# Patient Record
Sex: Female | Born: 1965 | Race: Black or African American | Hispanic: No | Marital: Single | Smoking: Former smoker
Health system: Southern US, Community
[De-identification: ages and names within clinical notes are randomized; demographics above are authoritative.]

## PROBLEM LIST (undated history)

## (undated) DIAGNOSIS — F32A Depression, unspecified: Secondary | ICD-10-CM

## (undated) DIAGNOSIS — F329 Major depressive disorder, single episode, unspecified: Secondary | ICD-10-CM

## (undated) DIAGNOSIS — Z87442 Personal history of urinary calculi: Secondary | ICD-10-CM

## (undated) DIAGNOSIS — I10 Essential (primary) hypertension: Secondary | ICD-10-CM

## (undated) DIAGNOSIS — E119 Type 2 diabetes mellitus without complications: Secondary | ICD-10-CM

---

## 2005-04-20 ENCOUNTER — Ambulatory Visit: Payer: Self-pay

## 2006-07-04 ENCOUNTER — Emergency Department: Payer: Self-pay | Admitting: Emergency Medicine

## 2006-07-04 ENCOUNTER — Ambulatory Visit: Payer: Self-pay | Admitting: Obstetrics and Gynecology

## 2007-06-27 ENCOUNTER — Ambulatory Visit: Payer: Self-pay

## 2008-10-16 ENCOUNTER — Ambulatory Visit: Payer: Self-pay | Admitting: Family Medicine

## 2010-07-07 ENCOUNTER — Ambulatory Visit: Payer: Self-pay | Admitting: Family Medicine

## 2012-07-03 ENCOUNTER — Ambulatory Visit: Payer: Self-pay | Admitting: Primary Care

## 2014-04-10 ENCOUNTER — Ambulatory Visit: Payer: Self-pay | Admitting: Primary Care

## 2016-01-23 ENCOUNTER — Encounter: Payer: Self-pay | Admitting: *Deleted

## 2016-01-23 ENCOUNTER — Ambulatory Visit
Admission: EM | Admit: 2016-01-23 | Discharge: 2016-01-23 | Disposition: A | Discharge status: Home / Self Care | Payer: 59 | Attending: Emergency Medicine | Admitting: Emergency Medicine

## 2016-01-23 ENCOUNTER — Ambulatory Visit (INDEPENDENT_AMBULATORY_CARE_PROVIDER_SITE_OTHER): Payer: 59

## 2016-01-23 DIAGNOSIS — R319 Hematuria, unspecified: Secondary | ICD-10-CM | POA: Diagnosis not present

## 2016-01-23 DIAGNOSIS — R109 Unspecified abdominal pain: Secondary | ICD-10-CM

## 2016-01-23 DIAGNOSIS — N12 Tubulo-interstitial nephritis, not specified as acute or chronic: Secondary | ICD-10-CM

## 2016-01-23 HISTORY — DX: Type 2 diabetes mellitus without complications: E11.9

## 2016-01-23 HISTORY — DX: Essential (primary) hypertension: I10

## 2016-01-23 LAB — URINALYSIS COMPLETE WITH MICROSCOPIC (ARMC ONLY)
BILIRUBIN URINE: NEGATIVE
GLUCOSE, UA: 500 mg/dL — AB
Ketones, ur: NEGATIVE mg/dL
Leukocytes, UA: NEGATIVE
NITRITE: NEGATIVE
Protein, ur: 100 mg/dL — AB
SPECIFIC GRAVITY, URINE: 1.02 (ref 1.005–1.030)
pH: 5.5 (ref 5.0–8.0)

## 2016-01-23 LAB — COMPREHENSIVE METABOLIC PANEL
ALBUMIN: 4.1 g/dL (ref 3.5–5.0)
ALK PHOS: 79 U/L (ref 38–126)
ALT: 17 U/L (ref 14–54)
ANION GAP: 8 (ref 5–15)
AST: 17 U/L (ref 15–41)
BUN: 16 mg/dL (ref 6–20)
CALCIUM: 9.2 mg/dL (ref 8.9–10.3)
CO2: 25 mmol/L (ref 22–32)
Chloride: 96 mmol/L — ABNORMAL LOW (ref 101–111)
Creatinine, Ser: 1.02 mg/dL — ABNORMAL HIGH (ref 0.44–1.00)
GFR calc Af Amer: >60 mL/min (ref >60)
GFR calc non Af Amer: >60 mL/min (ref >60)
GLUCOSE: 277 mg/dL — AB (ref 65–99)
POTASSIUM: 4.5 mmol/L (ref 3.5–5.1)
SODIUM: 129 mmol/L — AB (ref 135–145)
Total Bilirubin: 1 mg/dL (ref 0.3–1.2)
Total Protein: 9.4 g/dL — ABNORMAL HIGH (ref 6.5–8.1)

## 2016-01-23 LAB — CBC WITH DIFFERENTIAL/PLATELET
BASOS ABS: 0.1 10*3/uL (ref 0–0.1)
Eosinophils Absolute: 0.1 10*3/uL (ref 0–0.7)
HCT: 36.8 % (ref 35.0–47.0)
HEMOGLOBIN: 12.2 g/dL (ref 12.0–16.0)
LYMPHS ABS: 2.2 10*3/uL (ref 1.0–3.6)
MCH: 25.6 pg — ABNORMAL LOW (ref 26.0–34.0)
MCHC: 33.1 g/dL (ref 32.0–36.0)
MCV: 77.4 fL — ABNORMAL LOW (ref 80.0–100.0)
MONO ABS: 1.3 10*3/uL — AB (ref 0.2–0.9)
Monocytes Relative: 9 %
Neutro Abs: 11 10*3/uL — ABNORMAL HIGH (ref 1.4–6.5)
Neutrophils Relative %: 74 %
Platelets: 347 10*3/uL (ref 150–440)
RBC: 4.75 MIL/uL (ref 3.80–5.20)
RDW: 13.3 % (ref 11.5–14.5)
WBC: 14.7 10*3/uL — ABNORMAL HIGH (ref 3.6–11.0)

## 2016-01-23 LAB — PREGNANCY, URINE: Preg Test, Ur: NEGATIVE

## 2016-01-23 MED ORDER — PHENAZOPYRIDINE HCL 200 MG PO TABS: 200.0000 mg | ORAL_TABLET | Freq: Three times a day (TID) | ORAL | Status: DC | PRN

## 2016-01-23 MED ORDER — IBUPROFEN 800 MG PO TABS: 800.0000 mg | ORAL_TABLET | Freq: Three times a day (TID) | ORAL | Status: DC

## 2016-01-23 MED ORDER — KETOROLAC TROMETHAMINE 60 MG/2ML IM SOLN
60.0000 mg | Freq: Once | INTRAMUSCULAR | Status: AC
  Administered 2016-01-23: 60 mg via INTRAMUSCULAR

## 2016-01-23 MED ORDER — CEPHALEXIN 500 MG PO CAPS: 500.0000 mg | ORAL_CAPSULE | Freq: Three times a day (TID) | ORAL | Status: DC

## 2016-01-23 NOTE — Discharge Instructions (Signed)
Make sure he finished all of the antibiotics. The Pyridium will turn your urine orange, but it will help with the dysuria, urinary urgency, frequency. Make sure you drink plenty of fluids. Take Tylenol and ibuprofen together as needed for pain. Take 1 g of Tylenol with the ibuprofen 3 times a day. Follow-up with urology because of the blood in your urine. Go to the ER for nausea, vomiting, fevers above 100.4 despite being on the antibiotics, pain not controlled with medications, a change in your pain, or other concerns.

## 2016-01-23 NOTE — ED Notes (Signed)
Pt states that she has chills and left flank pain that started 3 days ago.

## 2016-01-23 NOTE — ED Provider Notes (Addendum)
HPI  SUBJECTIVE:  Emma Bishop is a 50 y.o. female who presents with urinary dysuria, urgency, frequency, cloudy and odorous urine, dark urine starting yesterday. She reports left-sided intermittent, low back pain that she describes as stabbing, burning, lasting anywhere from minutes to hours. She denies radiation or migration of this pain. States that she feels like she has difficulty emptying her bladder. She denies fevers, but reports chills. Denies bodyaches. No vaginal bleeding, discharge, odor, abdominal pain. No urinary or fecal incontinence, saddle anesthesia, weakness in either leg, radicular symptoms. No recent heavy lifting or trauma to her back. The back pain is not associated with any movement. She has taken Tylenol in the past 6-8 hours. No antibiotics in the past month. Past medical history of diabetes, does not check her sugar at home, states that she does not have a meter. Also history of hypertension. No history of UTIs, nephrolithiasis, pyelonephritis. Family history significant for brother with nephrolithiasis. LMP: February. Patient is sexually active. She is not sure if she could be pregnant. PMD: Dr. Freddy Finner at the [facility] clinic. She has an appt with her on Thursday.    Past Medical History  Diagnosis Date  . Diabetes mellitus without complication ([place])   . Hypertension     History reviewed. No pertinent past surgical history.  No family history on file.  Social History  Substance Use Topics  . Smoking status: Former Research scientist (life sciences)  . Smokeless tobacco: None  . Alcohol Use: Yes    No current facility-administered medications for this encounter.  Current outpatient prescriptions:  .  benazepril-hydrochlorthiazide (LOTENSIN HCT) 20-25 MG tablet, Take 1 tablet by mouth daily., Disp: , Rfl:  .  glipiZIDE (GLUCOTROL) 10 MG tablet, Take 10 mg by mouth daily before breakfast., Disp: , Rfl:  .  metFORMIN (GLUCOPHAGE) 1000 MG tablet, Take 1,000 mg by mouth 2  (two) times daily with a meal., Disp: , Rfl:  .  TRAZODONE HCL PO, Take by mouth., Disp: , Rfl:  .  cephALEXin (KEFLEX) 500 MG capsule, Take 1 capsule (500 mg total) by mouth 3 (three) times daily. X 14 days, Disp: 42 capsule, Rfl: 0 .  ibuprofen (ADVIL,MOTRIN) 800 MG tablet, Take 1 tablet (800 mg total) by mouth 3 (three) times daily., Disp: 30 tablet, Rfl: 0 .  phenazopyridine (PYRIDIUM) 200 MG tablet, Take 1 tablet (200 mg total) by mouth 3 (three) times daily as needed for pain., Disp: 6 tablet, Rfl: 0  No Known Allergies   ROS  As noted in HPI.   Physical Exam  BP 141/78 mmHg  Pulse 95  Temp(Src) 100.2 F (37.9 C) (Oral)  Ht 5\' 2"  (1.575 m)  Wt 287 lb (130.182 kg)  BMI 52.48 kg/m2  SpO2 96%  LMP 11/04/2015 (Approximate)  Constitutional: Well developed, well nourished, no acute distress. Morbidly obese. Eyes:  EOMI, conjunctiva normal bilaterally HENT: Normocephalic, atraumatic,mucus membranes moist Respiratory: Normal inspiratory effort Cardiovascular: Normal rate GI: nondistended, Soft, nontender. No suprapubic, flank tenderness. Normal bowel sounds. skin: No rash, skin intact Back: Left CVA tenderness. Musculoskeletal: - paralumbar tenderness, - muscle spasm. No bony tenderness. Bilateral lower extremities nontender. No pain with int/ext rotation flex/extension hips bilaterally. SLR neg bilaterally. Sensation baseline light touch bilaterally for Pt, DTR's symmetric and intact bilaterally KJ, Motor symmetric bilateral 5/5 hip flexion, quadriceps, hamstrings, EHL, foot dorsiflexion, foot plantarflexion, gait normal. Pain not aggravated with active hip flexion against resistance. Neurologic: Alert & oriented x 3, no focal neuro deficits Psychiatric: Speech and behavior  appropriate   ED Course   Medications  ketorolac (TORADOL) injection 60 mg (60 mg Intramuscular Given 01/23/16 1759)    Orders Placed This Encounter  Procedures  . Urine culture    Standing Status:  Standing     Number of Occurrences: 1     Standing Expiration Date:   . CT Renal Stone Study    Standing Status: Standing     Number of Occurrences: 1     Standing Expiration Date:     Order Specific Question:  Reason for Exam (SYMPTOM  OR DIAGNOSIS REQUIRED)    Answer:  Hematuria, left CVA tenderness rule out obstructing stone  . Urinalysis complete, with microscopic    Standing Status: Standing     Number of Occurrences: 1     Standing Expiration Date:   . Pregnancy, urine    Standing Status: Standing     Number of Occurrences: 1     Standing Expiration Date:   . CBC with Differential    Standing Status: Standing     Number of Occurrences: 1     Standing Expiration Date:   . Comprehensive metabolic panel    Standing Status: Standing     Number of Occurrences: 1     Standing Expiration Date:     Results for orders placed or performed during the hospital encounter of 01/23/16 (from the past 24 hour(s))  Urinalysis complete, with microscopic     Status: Abnormal   Collection Time: 01/23/16  4:45 PM  Result Value Ref Range   Color, Urine YELLOW YELLOW   APPearance HAZY (A) CLEAR   Glucose, UA 500 (A) NEGATIVE mg/dL   Bilirubin Urine NEGATIVE NEGATIVE   Ketones, ur NEGATIVE NEGATIVE mg/dL   Specific Gravity, Urine 1.020 1.005 - 1.030   Hgb urine dipstick 2+ (A) NEGATIVE   pH 5.5 5.0 - 8.0   Protein, ur 100 (A) NEGATIVE mg/dL   Nitrite NEGATIVE NEGATIVE   Leukocytes, UA NEGATIVE NEGATIVE   RBC / HPF TOO NUMEROUS TO COUNT 0 - 5 RBC/hpf   WBC, UA 0-5 0 - 5 WBC/hpf   Bacteria, UA FEW (A) NONE SEEN   Squamous Epithelial / LPF 0-5 (A) NONE SEEN  Pregnancy, urine     Status: None   Collection Time: 01/23/16  4:45 PM  Result Value Ref Range   Preg Test, Ur NEGATIVE NEGATIVE  CBC with Differential     Status: Abnormal   Collection Time: 01/23/16  5:46 PM  Result Value Ref Range   WBC 14.7 (H) 3.6 - 11.0 K/uL   RBC 4.75 3.80 - 5.20 MIL/uL   Hemoglobin 12.2 12.0 - 16.0 g/dL    HCT 36.8 35.0 - 47.0 %   MCV 77.4 (L) 80.0 - 100.0 fL   MCH 25.6 (L) 26.0 - 34.0 pg   MCHC 33.1 32.0 - 36.0 g/dL   RDW 13.3 11.5 - 14.5 %   Platelets 347 150 - 440 K/uL   Neutrophils Relative % 74% %   Neutro Abs 11.0 (H) 1.4 - 6.5 K/uL   Lymphocytes Relative 15% %   Lymphs Abs 2.2 1.0 - 3.6 K/uL   Monocytes Relative 9% %   Monocytes Absolute 1.3 (H) 0.2 - 0.9 K/uL   Eosinophils Relative 1% %   Eosinophils Absolute 0.1 0 - 0.7 K/uL   Basophils Relative 1% %   Basophils Absolute 0.1 0 - 0.1 K/uL  Comprehensive metabolic panel     Status: Abnormal   Collection Time:  01/23/16  5:46 PM  Result Value Ref Range   Sodium 129 (L) 135 - 145 mmol/L   Potassium 4.5 3.5 - 5.1 mmol/L   Chloride 96 (L) 101 - 111 mmol/L   CO2 25 22 - 32 mmol/L   Glucose, Bld 277 (H) 65 - 99 mg/dL   BUN 16 6 - 20 mg/dL   Creatinine, Ser 1.02 (H) 0.44 - 1.00 mg/dL   Calcium 9.2 8.9 - 10.3 mg/dL   Total Protein 9.4 (H) 6.5 - 8.1 g/dL   Albumin 4.1 3.5 - 5.0 g/dL   AST 17 15 - 41 U/L   ALT 17 14 - 54 U/L   Alkaline Phosphatase 79 38 - 126 U/L   Total Bilirubin 1.0 0.3 - 1.2 mg/dL   GFR calc non Af Amer >60 >60 mL/min   GFR calc Af Amer >60 >60 mL/min   Anion gap 8 5 - 15   Ct Renal Stone Study  01/23/2016  CLINICAL DATA:  LEFT side pain for 1 day, hematuria, LEFT CVA tenderness, question obstructing kidney stone EXAM: CT ABDOMEN AND PELVIS WITHOUT CONTRAST TECHNIQUE: Multidetector CT imaging of the abdomen and pelvis was performed following the standard protocol without IV contrast. Sagittal and coronal MPR images reconstructed from axial data set. Oral contrast not administered for this indication. COMPARISON:  None FINDINGS: Degradation of image quality secondary to body habitus with scattered streak artifacts. Lung bases clear. No definite urinary tract calcification, hydronephrosis, or ureteral dilatation. Within limits of a nonenhanced exam no focal abnormalities of the liver, gallbladder, spleen, pancreas,  kidneys, or adrenal glands. Unremarkable bladder and ureters. Slight nodular uterus question leiomyomata. Appendix and adnexa unremarkable. Stomach and bowel loops grossly normal appearance for technique. No mass, adenopathy, free air, free fluid, or inflammatory process. Osseous structures normal. IMPRESSION: No acute intra-abdominal or intrapelvic abnormalities. Question uterine leiomyomata. Electronically Signed   By: Lavonia Dana M.D.   On: 01/23/2016 18:26    ED Clinical Impression  Hematuria  Flank pain - Plan: CANCELED: CT Abdomen Pelvis Wo Contrast, CANCELED: CT Abdomen Pelvis Wo Contrast  Pyelonephritis   ED Assessment/Plan   Patient was given Toradol with significant improvement in her symptoms.   Reviewed labs. Patient has hematuria, but no bacteria, nitrite, leukocytes. Glucose and proteinuria noted.  no previous UAs for comparison. Concern for obstructing stone given fever and CVA tenderness. We'll check CBC, CMP, noncontrast abd/pelvis.  Urine culture sent.  Patient has white count is 14.7 with a left shift. Slightly hyponatremic, but she also has hyperglycemia. There is no evidence of acidosis. BUN and creatinine are acceptable. We'll treat as a pyelonephritis given the CVA tenderness and the fever with Keflex 500 mg 3 times a day, Pyridium. Also ibuprofen 800 mg 3 times a day as needed for fever and pain. Advised patient to follow-up with Dr. Hollice Espy, urology on call for further evaluation of the hematuria. She'll strain all of her urine. Patient was given a urine strainer.  Discussed labs, imaging, MDM, plan and followup with patient. gave patient strict ER return precautions.  Patient  agrees with plan.  *This clinic note was created using Dragon dictation software. Therefore, there may be occasional mistakes despite careful proofreading.  ?   Melynda Ripple, MD 01/23/16 Valerie Roys  Melynda Ripple, MD 01/23/16 [phone]

## 2016-01-25 LAB — URINE CULTURE: Culture: 80000 — AB

## 2017-04-27 ENCOUNTER — Other Ambulatory Visit: Payer: Self-pay | Admitting: Primary Care

## 2017-04-27 DIAGNOSIS — Z1231 Encounter for screening mammogram for malignant neoplasm of breast: Secondary | ICD-10-CM

## 2017-06-19 ENCOUNTER — Encounter: Payer: Self-pay | Admitting: Radiology

## 2017-06-19 ENCOUNTER — Ambulatory Visit
Admission: RE | Admit: 2017-06-19 | Discharge: 2017-06-19 | Disposition: A | Discharge status: Home / Self Care | Payer: 59 | Source: Ambulatory Visit | Attending: Primary Care | Admitting: Primary Care

## 2017-06-19 DIAGNOSIS — Z1231 Encounter for screening mammogram for malignant neoplasm of breast: Secondary | ICD-10-CM | POA: Diagnosis not present

## 2017-10-03 ENCOUNTER — Encounter: Payer: Self-pay | Admitting: *Deleted

## 2017-10-04 ENCOUNTER — Ambulatory Visit: Admission: RE | Admit: 2017-10-04 | Payer: 59 | Source: Ambulatory Visit | Admitting: Internal Medicine

## 2017-10-04 ENCOUNTER — Encounter: Admission: RE | Payer: Self-pay | Source: Ambulatory Visit

## 2017-10-04 HISTORY — DX: Personal history of urinary calculi: Z87.442

## 2017-10-04 HISTORY — DX: Depression, unspecified: F32.A

## 2017-10-04 HISTORY — DX: Major depressive disorder, single episode, unspecified: F32.9

## 2017-10-04 SURGERY — COLONOSCOPY WITH PROPOFOL
Anesthesia: General

## 2018-06-22 ENCOUNTER — Other Ambulatory Visit: Payer: Self-pay | Admitting: Primary Care

## 2018-06-22 DIAGNOSIS — Z1231 Encounter for screening mammogram for malignant neoplasm of breast: Secondary | ICD-10-CM

## 2018-07-09 ENCOUNTER — Ambulatory Visit
Admission: RE | Admit: 2018-07-09 | Discharge: 2018-07-09 | Disposition: A | Discharge status: Home / Self Care | Payer: BLUE CROSS/BLUE SHIELD | Source: Ambulatory Visit | Attending: Primary Care | Admitting: Primary Care

## 2018-07-09 DIAGNOSIS — Z1231 Encounter for screening mammogram for malignant neoplasm of breast: Secondary | ICD-10-CM

## 2018-08-24 ENCOUNTER — Other Ambulatory Visit: Payer: Self-pay

## 2018-08-24 DIAGNOSIS — Z1211 Encounter for screening for malignant neoplasm of colon: Secondary | ICD-10-CM

## 2018-09-20 ENCOUNTER — Encounter: Payer: Self-pay | Admitting: Emergency Medicine

## 2018-09-21 ENCOUNTER — Ambulatory Visit
Admission: RE | Admit: 2018-09-21 | Discharge: 2018-09-21 | Disposition: A | Discharge status: Home / Self Care | Payer: BLUE CROSS/BLUE SHIELD | Attending: Gastroenterology | Admitting: Gastroenterology

## 2018-09-21 ENCOUNTER — Ambulatory Visit: Payer: BLUE CROSS/BLUE SHIELD | Admitting: Anesthesiology

## 2018-09-21 ENCOUNTER — Telehealth: Payer: Self-pay | Admitting: Gastroenterology

## 2018-09-21 ENCOUNTER — Encounter: Payer: Self-pay | Admitting: *Deleted

## 2018-09-21 ENCOUNTER — Encounter
Admission: RE | Disposition: A | Discharge status: Home / Self Care | Payer: Self-pay | Source: Home / Self Care | Attending: Gastroenterology

## 2018-09-21 DIAGNOSIS — D122 Benign neoplasm of ascending colon: Secondary | ICD-10-CM | POA: Diagnosis not present

## 2018-09-21 DIAGNOSIS — Z87891 Personal history of nicotine dependence: Secondary | ICD-10-CM | POA: Insufficient documentation

## 2018-09-21 DIAGNOSIS — Z7984 Long term (current) use of oral hypoglycemic drugs: Secondary | ICD-10-CM | POA: Insufficient documentation

## 2018-09-21 DIAGNOSIS — Z7982 Long term (current) use of aspirin: Secondary | ICD-10-CM | POA: Diagnosis not present

## 2018-09-21 DIAGNOSIS — Z87442 Personal history of urinary calculi: Secondary | ICD-10-CM | POA: Diagnosis not present

## 2018-09-21 DIAGNOSIS — Z1211 Encounter for screening for malignant neoplasm of colon: Secondary | ICD-10-CM | POA: Diagnosis not present

## 2018-09-21 DIAGNOSIS — K6389 Other specified diseases of intestine: Secondary | ICD-10-CM

## 2018-09-21 DIAGNOSIS — K5730 Diverticulosis of large intestine without perforation or abscess without bleeding: Secondary | ICD-10-CM | POA: Insufficient documentation

## 2018-09-21 DIAGNOSIS — Z79899 Other long term (current) drug therapy: Secondary | ICD-10-CM | POA: Insufficient documentation

## 2018-09-21 DIAGNOSIS — K648 Other hemorrhoids: Secondary | ICD-10-CM | POA: Insufficient documentation

## 2018-09-21 DIAGNOSIS — I10 Essential (primary) hypertension: Secondary | ICD-10-CM | POA: Diagnosis not present

## 2018-09-21 DIAGNOSIS — D125 Benign neoplasm of sigmoid colon: Secondary | ICD-10-CM | POA: Diagnosis not present

## 2018-09-21 DIAGNOSIS — E119 Type 2 diabetes mellitus without complications: Secondary | ICD-10-CM | POA: Insufficient documentation

## 2018-09-21 DIAGNOSIS — F329 Major depressive disorder, single episode, unspecified: Secondary | ICD-10-CM | POA: Insufficient documentation

## 2018-09-21 HISTORY — PX: COLONOSCOPY WITH PROPOFOL: SHX5780

## 2018-09-21 LAB — GLUCOSE, CAPILLARY: Glucose-Capillary: 156 mg/dL — ABNORMAL HIGH (ref 70–99)

## 2018-09-21 LAB — POCT PREGNANCY, URINE: Preg Test, Ur: NEGATIVE

## 2018-09-21 SURGERY — COLONOSCOPY WITH PROPOFOL
Anesthesia: General

## 2018-09-21 MED ORDER — GLYCOPYRROLATE 0.2 MG/ML IJ SOLN
INTRAMUSCULAR | Status: AC
  Filled 2018-09-21: qty 1

## 2018-09-21 MED ORDER — SODIUM CHLORIDE 0.9 % IV SOLN
INTRAVENOUS | Status: DC
  Administered 2018-09-21: 08:00:00 via INTRAVENOUS

## 2018-09-21 MED ORDER — MIDAZOLAM HCL 2 MG/2ML IJ SOLN
INTRAMUSCULAR | Status: DC | PRN
  Administered 2018-09-21: 2 mg via INTRAVENOUS

## 2018-09-21 MED ORDER — PROPOFOL 500 MG/50ML IV EMUL
INTRAVENOUS | Status: DC | PRN
  Administered 2018-09-21: 150 ug/kg/min via INTRAVENOUS

## 2018-09-21 MED ORDER — PROPOFOL 500 MG/50ML IV EMUL
INTRAVENOUS | Status: AC
  Filled 2018-09-21: qty 50

## 2018-09-21 MED ORDER — MIDAZOLAM HCL 2 MG/2ML IJ SOLN
INTRAMUSCULAR | Status: AC
  Filled 2018-09-21: qty 2

## 2018-09-21 MED ORDER — PROPOFOL 10 MG/ML IV BOLUS
INTRAVENOUS | Status: DC | PRN
  Administered 2018-09-21: 80 mg via INTRAVENOUS

## 2018-09-21 MED ORDER — SUCCINYLCHOLINE CHLORIDE 20 MG/ML IJ SOLN
INTRAMUSCULAR | Status: AC
  Filled 2018-09-21: qty 1

## 2018-09-21 MED ORDER — LIDOCAINE HCL (CARDIAC) PF 100 MG/5ML IV SOSY
PREFILLED_SYRINGE | INTRAVENOUS | Status: DC | PRN
  Administered 2018-09-21: 40 mg via INTRAVENOUS

## 2018-09-21 MED ORDER — LIDOCAINE HCL (PF) 2 % IJ SOLN
INTRAMUSCULAR | Status: AC
  Filled 2018-09-21: qty 10

## 2018-09-21 NOTE — Anesthesia Procedure Notes (Signed)
Date/Time: 09/21/2018 8:16 AM Performed by: Doreen Salvage, CRNA Pre-anesthesia Checklist: Patient identified, Emergency Drugs available, Suction available and Patient being monitored Patient Re-evaluated:Patient Re-evaluated prior to induction Oxygen Delivery Method: Nasal cannula Induction Type: IV induction Dental Injury: Teeth and Oropharynx as per pre-operative assessment  Comments: Nasal cannula with etCO2 monitoring

## 2018-09-21 NOTE — Transfer of Care (Signed)
Immediate Anesthesia Transfer of Care Note  Patient: Emma Bishop  Procedure(s) Performed: Procedure(s): COLONOSCOPY WITH PROPOFOL (N/A)  Patient Location: PACU and Endoscopy Unit  Anesthesia Type:General  Level of Consciousness: sedated  Airway & Oxygen Therapy: Patient Spontanous Breathing and Patient connected to nasal cannula oxygen  Post-op Assessment: Report given to RN and Post -op Vital signs reviewed and stable  Post vital signs: Reviewed and stable  Last Vitals:  Vitals:   09/21/18 0740 09/21/18 0852  BP: 102/71 139/87  Pulse: (!) 105 88  Resp: 20 16  Temp: 36.8 C (!) 36.1 C  SpO2: 43% 20%    Complications: No apparent anesthesia complications

## 2018-09-21 NOTE — Op Note (Signed)
[facility] Patient Name: Zuriel Yeaman Procedure Date: 09/21/2018 7:21 AM MRN: [id number] Account #: [id number] Date of Birth: 1966-01-06 Admit Type: Outpatient Age: 53 Room: [facility] ENDO ROOM 2 Gender: Female Note Status: Finalized Procedure:            Colonoscopy Indications:          Screening for colorectal malignant neoplasm Providers:            Symphonie Schneiderman B. Bonna Gains MD, MD Medicines:            Monitored Anesthesia Care Complications:        No immediate complications. Procedure:            Pre-Anesthesia Assessment:                       - ASA Grade Assessment: II - A patient with mild                        systemic disease.                       - Prior to the procedure, a History and Physical was                        performed, and patient medications, allergies and                        sensitivities were reviewed. The patient's tolerance of                        previous anesthesia was reviewed.                       - The risks and benefits of the procedure and the                        sedation options and risks were discussed with the                        patient. All questions were answered and informed                        consent was obtained.                       - Patient identification and proposed procedure were                        verified prior to the procedure by the physician, the                        nurse, the anesthesiologist, the anesthetist and the                        technician. The procedure was verified in the procedure                        room.                       After obtaining informed consent, the colonoscope was  passed under direct vision. Throughout the procedure,                        the patient's blood pressure, pulse, and oxygen                        saturations were monitored continuously. The                        Colonoscope was introduced through the  anus and                        advanced to the the cecum, identified by appendiceal                        orifice and ileocecal valve. The colonoscopy was                        performed with ease. The patient tolerated the                        procedure well. The quality of the bowel preparation                        was good. Findings:      The perianal and digital rectal examinations were normal.      A localized area of mildly thickened folds of the mucosa was found in       the cecum. Biopsies were taken with a cold forceps for histology. This       is likely external compression. The mucosa in the area was normal. It       was soft when touched with a biopsy forceps.      A 4 mm polyp was found in the ascending colon. The polyp was sessile.       The polyp was removed with a cold biopsy forceps. Resection and       retrieval were complete.      A 7 mm polyp was found in the sigmoid colon. The polyp was       semi-pedunculated. The polyp was removed with a hot snare. Resection and       retrieval were complete.      Multiple diverticula were found in the sigmoid colon and ascending colon.      The exam was otherwise without abnormality.      The rectum, sigmoid colon, descending colon, transverse colon, ascending       colon and cecum appeared normal.      Non-bleeding internal hemorrhoids were found during retroflexion. Impression:           - Thickened folds of the mucosa in the cecum. Biopsied.                       - One 4 mm polyp in the ascending colon, removed with a                        cold biopsy forceps. Resected and retrieved.                       - One 7 mm polyp in the sigmoid colon, removed with a  hot snare. Resected and retrieved.                       - Diverticulosis in the sigmoid colon and in the                        ascending colon.                       - The examination was otherwise normal.                       - The  rectum, sigmoid colon, descending colon,                        transverse colon, ascending colon and cecum are normal.                       - Non-bleeding internal hemorrhoids. Recommendation:       - Pt may need a CT scan to evaluate the area in the                        cecum of possible external compression. Will await                        biopsy results. Follow up in GI clinic to discuss this.                       - Return to GI clinic in 2 weeks.                       - Discharge patient to home (with escort).                       - Advance diet as tolerated.                       - Continue present medications.                       - Await pathology results.                       - Repeat colonoscopy in 5 years.                       - The findings and recommendations were discussed with                        the patient.                       - The findings and recommendations were discussed with                        the patient's family.                       - Return to primary care physician as previously                        scheduled.                       -  High fiber diet. Procedure Code(s):    --- Professional ---                       [phone], Colonoscopy, flexible; with removal of tumor(s),                        polyp(s), or other lesion(s) by snare technique                       45380, 73, Colonoscopy, flexible; with biopsy, single                        or multiple Diagnosis Code(s):    --- Professional ---                       Z12.11, Encounter for screening for malignant neoplasm                        of colon                       K63.89, Other specified diseases of intestine                       D12.2, Benign neoplasm of ascending colon                       D12.5, Benign neoplasm of sigmoid colon                       K64.8, Other hemorrhoids                       K57.30, Diverticulosis of large intestine without                         perforation or abscess without bleeding CPT copyright 2018 American Medical Association. All rights reserved. The codes documented in this report are preliminary and upon coder review may  be revised to meet current compliance requirements.  Deven Antigua, MD Margretta Sidle B. Bonna Gains MD, MD 09/21/2018 8:54:31 AM This report has been signed electronically. Number of Addenda: 0 Note Initiated On: 09/21/2018 7:21 AM Scope Withdrawal Time: 0 hours 20 minutes 23 seconds  Total Procedure Duration: 0 hours 27 minutes 49 seconds  Estimated Blood Loss: Estimated blood loss: none.      [facility]

## 2018-09-21 NOTE — H&P (Signed)
Emma Antigua, MD [address], [place], [place], [place], [address] [id number], [place], [place], [place], [address] Phone: [phone]  Fax: [phone]  Primary Care Physician:  Freddy Finner, NP   Pre-Procedure History & Physical: HPI:  Emma Bishop is a 53 y.o. female is here for a colonoscopy.   Past Medical History:  Diagnosis Date  . Depression   . Diabetes mellitus without complication ([place])   . History of kidney stones   . Hypertension     History reviewed. No pertinent surgical history.  Prior to Admission medications   Medication Sig Start Date End Date Taking? Authorizing Provider  aspirin EC 81 MG tablet Take 81 mg by mouth daily.   Yes [provider]  glipiZIDE (GLUCOTROL) 10 MG tablet Take 10 mg by mouth daily before breakfast.   Yes [provider]  linagliptin (TRADJENTA) 5 MG TABS tablet Take 5 mg by mouth daily.   Yes [provider]  lisinopril-hydrochlorothiazide (PRINZIDE,ZESTORETIC) 20-25 MG tablet Take 1 tablet by mouth daily.   Yes [provider]  metFORMIN (GLUCOPHAGE) 1000 MG tablet Take 1,000 mg by mouth 2 (two) times daily with a meal.   Yes [provider]  ibuprofen (ADVIL,MOTRIN) 800 MG tablet Take 1 tablet (800 mg total) by mouth 3 (three) times daily. Patient not taking: Reported on 09/21/2018 01/23/16   Melynda Ripple, MD    Allergies as of 08/24/2018  . (No Known Allergies)    Family History  Problem Relation Age of Onset  . Colon cancer Maternal Uncle   . Breast cancer Neg Hx     Social History   Socioeconomic History  . Marital status: Single    Spouse name: Not on file  . Number of children: Not on file  . Years of education: Not on file  . Highest education level: Not on file  Occupational History  . Not on file  Social Needs  . Financial resource strain: Not on file  . Food insecurity:    Worry: Not on file    Inability: Not on file  . Transportation  needs:    Medical: Not on file    Non-medical: Not on file  Tobacco Use  . Smoking status: Former Research scientist (life sciences)  . Smokeless tobacco: Never Used  Substance and Sexual Activity  . Alcohol use: Yes  . Drug use: No  . Sexual activity: Not on file  Lifestyle  . Physical activity:    Days per week: Not on file    Minutes per session: Not on file  . Stress: Not on file  Relationships  . Social connections:    Talks on phone: Not on file    Gets together: Not on file    Attends religious service: Not on file    Active member of club or organization: Not on file    Attends meetings of clubs or organizations: Not on file    Relationship status: Not on file  . Intimate partner violence:    Fear of current or ex partner: Not on file    Emotionally abused: Not on file    Physically abused: Not on file    Forced sexual activity: Not on file  Other Topics Concern  . Not on file  Social History Narrative  . Not on file    Review of Systems: See HPI, otherwise negative ROS  Physical Exam: BP 102/71   Pulse (!) 105   Temp 98.2 F (36.8 C) (Tympanic)  Resp 20   Ht 5\' 1"  (1.549 m)   Wt 122 kg   LMP 11/04/2015 (Approximate) Comment: negative preg test  SpO2 99%   BMI 50.83 kg/m  General:   Alert,  pleasant and cooperative in NAD Head:  Normocephalic and atraumatic. Neck:  Supple; no masses or thyromegaly. Lungs:  Clear throughout to auscultation, normal respiratory effort.    Heart:  +S1, +S2, Regular rate and rhythm, No edema. Abdomen:  Soft, nontender and nondistended. Normal bowel sounds, without guarding, and without rebound.   Neurologic:  Alert and  oriented x4;  grossly normal neurologically.  Impression/Plan: Emma Bishop is here for a colonoscopy to be performed for average risk screening.  Risks, benefits, limitations, and alternatives regarding  colonoscopy have been reviewed with the patient.  Questions have been answered.  All parties agreeable.   Virgel Manifold, MD  09/21/2018, 8:13 AM

## 2018-09-21 NOTE — Anesthesia Preprocedure Evaluation (Signed)
Anesthesia Evaluation  Patient identified by MRN, date of birth, ID band Patient awake    Reviewed: Allergy & Precautions, H&P , NPO status , Patient's Chart, lab work & pertinent test results, reviewed documented beta blocker date and time   Airway Mallampati: II   Neck ROM: full    Dental  (+) Poor Dentition   Pulmonary neg pulmonary ROS, former smoker,    Pulmonary exam normal        Cardiovascular hypertension, On Medications negative cardio ROS Normal cardiovascular exam Rhythm:regular Rate:Normal     Neuro/Psych PSYCHIATRIC DISORDERS Depression negative neurological ROS     GI/Hepatic negative GI ROS, Neg liver ROS,   Endo/Other  negative endocrine ROSdiabetes, Well Controlled, Type 2, Oral Hypoglycemic Agents  Renal/GU negative Renal ROS  negative genitourinary   Musculoskeletal   Abdominal   Peds  Hematology negative hematology ROS (+)   Anesthesia Other Findings Past Medical History: No date: Depression No date: Diabetes mellitus without complication ([facility]) No date: History of kidney stones No date: Hypertension History reviewed. No pertinent surgical history.   Reproductive/Obstetrics negative OB ROS                             Anesthesia Physical Anesthesia Plan  ASA: II  Anesthesia Plan: General   Post-op Pain Management:    Induction:   PONV Risk Score and Plan:   Airway Management Planned:   Additional Equipment:   Intra-op Plan:   Post-operative Plan:   Informed Consent: I have reviewed the patients History and Physical, chart, labs and discussed the procedure including the risks, benefits and alternatives for the proposed anesthesia with the patient or authorized representative who has indicated his/her understanding and acceptance.   Dental Advisory Given  Plan Discussed with: CRNA  Anesthesia Plan Comments:         Anesthesia Quick  Evaluation

## 2018-09-21 NOTE — Anesthesia Post-op Follow-up Note (Signed)
Anesthesia QCDR form completed.        

## 2018-09-24 ENCOUNTER — Other Ambulatory Visit: Payer: Self-pay | Admitting: Gastroenterology

## 2018-09-24 DIAGNOSIS — K6389 Other specified diseases of intestine: Secondary | ICD-10-CM

## 2018-09-24 LAB — SURGICAL PATHOLOGY

## 2018-09-24 NOTE — Telephone Encounter (Signed)
error 

## 2018-09-25 ENCOUNTER — Ambulatory Visit: Payer: BLUE CROSS/BLUE SHIELD | Admitting: Gastroenterology

## 2018-09-25 NOTE — Anesthesia Postprocedure Evaluation (Signed)
Anesthesia Post Note  Patient: Emma Bishop  Procedure(s) Performed: COLONOSCOPY WITH PROPOFOL (N/A )  Patient location during evaluation: PACU Anesthesia Type: General Level of consciousness: awake and alert Pain management: pain level controlled Vital Signs Assessment: post-procedure vital signs reviewed and stable Respiratory status: spontaneous breathing, nonlabored ventilation, respiratory function stable and patient connected to nasal cannula oxygen Cardiovascular status: blood pressure returned to baseline and stable Postop Assessment: no apparent nausea or vomiting Anesthetic complications: no     Last Vitals:  Vitals:   09/21/18 0954 09/21/18 1005  BP: 96/64 (!) 90/57  Pulse: 91 91  Resp: 17 12  Temp:    SpO2: 99% 99%    Last Pain:  Vitals:   09/21/18 1005  TempSrc:   PainSc: 0-No pain                 Molli Barrows

## 2018-10-02 ENCOUNTER — Ambulatory Visit (INDEPENDENT_AMBULATORY_CARE_PROVIDER_SITE_OTHER): Payer: BLUE CROSS/BLUE SHIELD | Admitting: Gastroenterology

## 2018-10-02 ENCOUNTER — Encounter (INDEPENDENT_AMBULATORY_CARE_PROVIDER_SITE_OTHER): Payer: Self-pay

## 2018-10-02 ENCOUNTER — Encounter: Payer: Self-pay | Admitting: Gastroenterology

## 2018-10-02 VITALS — BP 121/83 | HR 101 | Ht 62.0 in | Wt 278.8 lb

## 2018-10-02 DIAGNOSIS — K6389 Other specified diseases of intestine: Secondary | ICD-10-CM

## 2018-10-02 NOTE — Patient Instructions (Signed)
CT abdomen and pelvis on 10/05/2018, Friday at the [facility] on [address] [place], [place]. Arrival time 7:45 am, CT at 8:00 am. Pick up oral contrast for CT at least the day before CT.  Nothing to eat or drink 8 hrs prior to CT. Also please get Creatine (lab) done prior to Ct.

## 2018-10-03 NOTE — Progress Notes (Signed)
Emma Bishop [address]  [place], [place] [address]  Main: [phone]  Fax: [phone]   Gastroenterology Consultation  Referring Provider:     Freddy Finner, NP Primary Care Physician:  Freddy Finner, NP Reason for Consultation:     Follow-up for colonoscopy findings        HPI:    Chief Complaint  Patient presents with  . New Patient (Initial Visit)    Hospital f/u to colonoscopy    Emma Bishop is a 53 y.o. y/o female referred for consultation & management  by Dr. Freddy Finner, NP.  Patient underwent screening colonoscopy on January 3.  2 subcentimeter colon polyps were removed from the ascending and sigmoid colon.  There was an area of external compression seen in the cecum and mucosa in the area appeared normal and was biopsied.  Diverticulosis was also noted. Pathology showed tubular adenoma colon polyps, cecal biopsy was normal.  Patient is here to discuss further management of the external compression seen during the colonoscopy.  The patient denies abdominal or flank pain, anorexia, nausea or vomiting, dysphagia, change in bowel habits or black or bloody stools or weight loss.  Denies any family history of colon cancer.   Past Medical History:  Diagnosis Date  . Depression   . Diabetes mellitus without complication ([place])   . History of kidney stones   . Hypertension     Past Surgical History:  Procedure Laterality Date  . COLONOSCOPY WITH PROPOFOL N/A 09/21/2018   Procedure: COLONOSCOPY WITH PROPOFOL;  Surgeon: Virgel Manifold, MD;  Location: [facility] ENDOSCOPY;  Service: Gastroenterology;  Laterality: N/A;    Prior to Admission medications   Medication Sig Start Date End Date Taking? Authorizing Provider  aspirin EC 81 MG tablet Take 81 mg by mouth daily.   Yes [provider]  glipiZIDE (GLUCOTROL) 10 MG tablet Take 10 mg by mouth daily before breakfast.   Yes [provider]  ibuprofen (ADVIL,MOTRIN)  800 MG tablet Take 1 tablet (800 mg total) by mouth 3 (three) times daily. 01/23/16  Yes Melynda Ripple, MD  linagliptin (TRADJENTA) 5 MG TABS tablet Take 5 mg by mouth daily.   Yes [provider]  lisinopril-hydrochlorothiazide (PRINZIDE,ZESTORETIC) 20-25 MG tablet Take 1 tablet by mouth daily.   Yes [provider]  metFORMIN (GLUCOPHAGE) 1000 MG tablet Take 1,000 mg by mouth 2 (two) times daily with a meal.   Yes [provider]    Family History  Problem Relation Age of Onset  . Colon cancer Maternal Uncle   . Breast cancer Neg Hx      Social History   Tobacco Use  . Smoking status: Former Research scientist (life sciences)  . Smokeless tobacco: Never Used  Substance Use Topics  . Alcohol use: Yes  . Drug use: No    Allergies as of 10/02/2018  . (No Known Allergies)    Review of Systems:    All systems reviewed and negative except where noted in HPI.   Physical Exam:  BP 121/83   Pulse (!) 101   Ht 5\' 2"  (1.575 m)   Wt 278 lb 12.8 oz (126.5 kg)   LMP 11/04/2015 (Approximate) Comment: negative preg test  BMI 50.99 kg/m  Patient's last menstrual period was 11/04/2015 (approximate). Psych:  Alert and cooperative. Normal mood and affect. General:   Alert,  Well-developed, well-nourished, pleasant and cooperative in NAD Head:  Normocephalic and atraumatic. Eyes:  Sclera clear, no icterus.  Conjunctiva pink. Ears:  Normal auditory acuity. Nose:  No deformity, discharge, or lesions. Mouth:  No deformity or lesions,oropharynx pink & moist. Neck:  Supple; no masses or thyromegaly. Abdomen:  Normal bowel sounds.  No bruits.  Soft, non-tender and non-distended without masses, hepatosplenomegaly or hernias noted.  No guarding or rebound tenderness.    Msk:  Symmetrical without gross deformities. Good, equal movement & strength bilaterally. Pulses:  Normal pulses noted. Extremities:  No clubbing or edema.  No cyanosis. Neurologic:  Alert and oriented x3;  grossly normal  neurologically. Skin:  Intact without significant lesions or rashes. No jaundice. Lymph Nodes:  No significant cervical adenopathy. Psych:  Alert and cooperative. Normal mood and affect.   Labs: CBC    Component Value Date/Time   WBC 14.7 (H) 01/23/2016 1746   RBC 4.75 01/23/2016 1746   HGB 12.2 01/23/2016 1746   HCT 36.8 01/23/2016 1746   PLT 347 01/23/2016 1746   MCV 77.4 (L) 01/23/2016 1746   MCH 25.6 (L) 01/23/2016 1746   MCHC 33.1 01/23/2016 1746   RDW 13.3 01/23/2016 1746   LYMPHSABS 2.2 01/23/2016 1746   MONOABS 1.3 (H) 01/23/2016 1746   EOSABS 0.1 01/23/2016 1746   BASOSABS 0.1 01/23/2016 1746   CMP     Component Value Date/Time   NA 129 (L) 01/23/2016 1746   K 4.5 01/23/2016 1746   CL 96 (L) 01/23/2016 1746   CO2 25 01/23/2016 1746   GLUCOSE 277 (H) 01/23/2016 1746   BUN 16 01/23/2016 1746   CREATININE 1.02 (H) 01/23/2016 1746   CALCIUM 9.2 01/23/2016 1746   PROT 9.4 (H) 01/23/2016 1746   ALBUMIN 4.1 01/23/2016 1746   AST 17 01/23/2016 1746   ALT 17 01/23/2016 1746   ALKPHOS 79 01/23/2016 1746   BILITOT 1.0 01/23/2016 1746   GFRNONAA >60 01/23/2016 1746   GFRAA >60 01/23/2016 1746    Imaging Studies: No results found.  Assessment and Plan:   Emma Bishop is a 53 y.o. y/o female has been referred for follow-up of colonoscopy findings  Patient denies any symptoms, no weight loss, no alarm symptoms We have discussed that the external compression seen in the cecum during the colonoscopy is likely a benign finding   A CT would help [place] evaluate the area and rule out any lesions in the area. Patient is willing to proceed with CT scan  Repeat colonoscopy in 5 years  Dr Ahtziry Bishop  Speech recognition software was used to dictate the above note.

## 2018-10-04 ENCOUNTER — Other Ambulatory Visit: Payer: Self-pay

## 2018-10-04 DIAGNOSIS — K6389 Other specified diseases of intestine: Secondary | ICD-10-CM

## 2018-10-04 LAB — CREATININE, SERUM
Creatinine, Ser: 1.25 mg/dL — ABNORMAL HIGH (ref 0.57–1.00)
GFR calc non Af Amer: 49 mL/min/{1.73_m2} — ABNORMAL LOW (ref >59)
GFR, EST AFRICAN AMERICAN: 57 mL/min/{1.73_m2} — AB (ref >59)

## 2018-10-05 ENCOUNTER — Ambulatory Visit
Admission: RE | Admit: 2018-10-05 | Discharge: 2018-10-05 | Disposition: A | Discharge status: Home / Self Care | Payer: BLUE CROSS/BLUE SHIELD | Source: Ambulatory Visit | Attending: Gastroenterology | Admitting: Gastroenterology

## 2018-10-05 ENCOUNTER — Ambulatory Visit: Payer: BLUE CROSS/BLUE SHIELD

## 2018-10-05 DIAGNOSIS — K6389 Other specified diseases of intestine: Secondary | ICD-10-CM | POA: Insufficient documentation

## 2018-10-16 ENCOUNTER — Telehealth: Payer: Self-pay

## 2018-10-16 NOTE — Telephone Encounter (Signed)
LMTCO.

## 2018-10-16 NOTE — Telephone Encounter (Signed)
-----   Message from Virgel Manifold, MD sent at 10/05/2018 10:04 AM EST ----- Shontell Prosser please let patient know, her CT scan does not show any mass in the region of the cecum which is good. She has some uterine fibroids and the CT is also reporting changes in her lower spine. This is not worrisome, but may or may not need further workup. She should follow up with her PCP in this regard.

## 2018-10-17 NOTE — Telephone Encounter (Signed)
Pt is calling for imaging results

## 2018-10-19 NOTE — Telephone Encounter (Signed)
Pt aware.

## 2019-08-20 ENCOUNTER — Other Ambulatory Visit: Payer: Self-pay | Admitting: Primary Care

## 2019-08-20 DIAGNOSIS — Z1231 Encounter for screening mammogram for malignant neoplasm of breast: Secondary | ICD-10-CM

## 2020-09-30 ENCOUNTER — Other Ambulatory Visit: Payer: Self-pay | Admitting: Primary Care

## 2020-09-30 DIAGNOSIS — Z1231 Encounter for screening mammogram for malignant neoplasm of breast: Secondary | ICD-10-CM

## 2020-11-05 ENCOUNTER — Ambulatory Visit
Admission: RE | Admit: 2020-11-05 | Discharge: 2020-11-05 | Disposition: A | Discharge status: Home / Self Care | Payer: BC Managed Care – PPO | Source: Ambulatory Visit | Attending: Primary Care | Admitting: Primary Care

## 2020-11-05 ENCOUNTER — Other Ambulatory Visit: Payer: Self-pay

## 2020-11-05 DIAGNOSIS — Z1231 Encounter for screening mammogram for malignant neoplasm of breast: Secondary | ICD-10-CM | POA: Insufficient documentation

## 2021-10-12 ENCOUNTER — Other Ambulatory Visit: Payer: Self-pay | Admitting: Primary Care

## 2021-10-12 DIAGNOSIS — Z1231 Encounter for screening mammogram for malignant neoplasm of breast: Secondary | ICD-10-CM

## 2021-11-16 ENCOUNTER — Ambulatory Visit
Admission: RE | Admit: 2021-11-16 | Discharge: 2021-11-16 | Disposition: A | Discharge status: Home / Self Care | Payer: BC Managed Care – PPO | Source: Ambulatory Visit | Attending: Primary Care | Admitting: Primary Care

## 2021-11-16 ENCOUNTER — Other Ambulatory Visit: Payer: Self-pay

## 2021-11-16 DIAGNOSIS — Z1231 Encounter for screening mammogram for malignant neoplasm of breast: Secondary | ICD-10-CM | POA: Diagnosis not present

## 2021-11-19 ENCOUNTER — Other Ambulatory Visit: Payer: Self-pay | Admitting: Primary Care

## 2021-11-19 DIAGNOSIS — Z1231 Encounter for screening mammogram for malignant neoplasm of breast: Secondary | ICD-10-CM

## 2021-11-25 ENCOUNTER — Ambulatory Visit: Payer: Self-pay | Admitting: Nurse Practitioner

## 2021-11-25 ENCOUNTER — Encounter: Payer: Self-pay | Admitting: Nurse Practitioner

## 2021-11-25 ENCOUNTER — Other Ambulatory Visit: Payer: Self-pay

## 2021-11-25 VITALS — BP 112/76 | HR 93 | Temp 97.5°F | Resp 16 | Ht 62.0 in | Wt 259.0 lb

## 2021-11-25 DIAGNOSIS — R5383 Other fatigue: Secondary | ICD-10-CM

## 2021-11-25 DIAGNOSIS — Z20822 Contact with and (suspected) exposure to covid-19: Secondary | ICD-10-CM

## 2021-11-25 DIAGNOSIS — J301 Allergic rhinitis due to pollen: Secondary | ICD-10-CM

## 2021-11-25 LAB — POC COVID19 BINAXNOW: SARS Coronavirus 2 Ag: NEGATIVE

## 2021-11-25 MED ORDER — FLUTICASONE PROPIONATE 50 MCG/ACT NA SUSP: 2.0000 | Freq: Every day | NASAL | 6 refills | Status: AC

## 2021-11-25 NOTE — Progress Notes (Signed)
? ?Subjective:  ? ? Patient ID: Emma Bishop, female    DOB: 1966-07-07, 56 y.o.   MRN: [id number] ? ?HPI ? ?56 y/o female presents to the clinic for complaints of fatigue x4 days. States she took a Covid test on 3/8 which was faintly positive and she states that she could not taste her food. Repeat Covid screen today at home was negative. She denies any cough, fever, body aches, chills, nausea, vomiting, or diarrhea. She does complain of a "tickle" in the back of her throat that has caused her to need to clear her throat frequently. She does have a history of seasonal allergies in the spring time and has used OTC Claritin and flonase in the past, not currently taking. ? ?Has been vaccinated and received a Covid booster. ? ?Review of Systems  ?Constitutional:  Positive for activity change and fatigue. Negative for appetite change, chills, diaphoresis and fever.  ?HENT: Negative.    ?Eyes: Negative.   ?Respiratory: Negative.    ?Cardiovascular: Negative.   ?Gastrointestinal: Negative.   ?Endocrine: Negative.   ?Genitourinary: Negative.   ?Musculoskeletal: Negative.   ?Skin: Negative.   ?Allergic/Immunologic: Negative.   ?Neurological: Negative.   ?Hematological: Negative.   ?Psychiatric/Behavioral: Negative.    ?All other systems reviewed and are negative. ? ?   ?Objective:  ? Physical Exam ?Vitals reviewed.  ?Constitutional:   ?   General: She is not in acute distress. ?   Appearance: Normal appearance. She is obese. She is not ill-appearing.  ?HENT:  ?   Head: Normocephalic and atraumatic.  ?   Right Ear: Tympanic membrane, ear canal and external ear normal.  ?   Left Ear: Tympanic membrane, ear canal and external ear normal.  ?   Nose: Nose normal.  ?   Mouth/Throat:  ?   Mouth: Mucous membranes are moist.  ?   Pharynx: Oropharynx is clear. No oropharyngeal exudate or posterior oropharyngeal erythema.  ?Eyes:  ?   Extraocular Movements: Extraocular movements intact.  ?   Conjunctiva/sclera: Conjunctivae  normal.  ?   Pupils: Pupils are equal, round, and reactive to light.  ?Cardiovascular:  ?   Rate and Rhythm: Normal rate and regular rhythm.  ?   Pulses: Normal pulses.  ?   Heart sounds: Normal heart sounds. No murmur heard. ?  No friction rub. No gallop.  ?Pulmonary:  ?   Effort: Pulmonary effort is normal.  ?   Breath sounds: Normal breath sounds.  ?Musculoskeletal:     ?   General: Normal range of motion.  ?   Cervical back: Normal range of motion and neck supple.  ?Skin: ?   General: Skin is warm and dry.  ?Neurological:  ?   General: No focal deficit present.  ?   Mental Status: She is alert and oriented to person, place, and time.  ?Psychiatric:     ?   Mood and Affect: Mood normal.     ?   Behavior: Behavior normal.     ?   Thought Content: Thought content normal.     ?   Judgment: Judgment normal.  ? ? ?Vitals:  ? 11/25/21 1001  ?BP: 112/76  ?Pulse: 93  ?Resp: 16  ?Temp: (!) 97.5 ?F (36.4 ?C)  ?SpO2: 99%  ? ?Recent Results (from the past 2160 hour(s))  ?POC COVID-19     Status: Normal  ? Collection Time: 11/25/21 10:17 AM  ?Result Value Ref Range  ? SARS Coronavirus 2 Ag Negative  Negative  ?  ? ? ?   ?Assessment & Plan:  ?1. Encounter for screening laboratory testing for COVID-19 virus ? ?- POC COVID-19 ? ?2. Seasonal allergic rhinitis due to pollen ?Flonase prescription called to pharmacy. Encouraged to begin taking OTC Claritin and using flonase twice daily for relieve of allergy symptoms.  ?   ? ?

## 2021-11-25 NOTE — Progress Notes (Signed)
?Virtual Visit Consent  ? ?[name], you are scheduled for a virtual visit with a [facility] provider today.   ?  ?Just as with appointments in the office, your consent must be obtained to participate.  Your consent will be active for this visit and any virtual visit you may have with one of our providers in the next 365 days.   ?  ?If you have a MyChart account, a copy of this consent can be sent to you electronically.  All virtual visits are billed to your insurance company just like a traditional visit in the office.   ? ?As this is a virtual visit, video technology does not allow for your provider to perform a traditional examination.  This may limit your provider's ability to fully assess your condition.  If your provider identifies any concerns that need to be evaluated in person or the need to arrange testing (such as labs, EKG, etc.), we will make arrangements to do so.   ?  ?Although advances in technology are sophisticated, we cannot ensure that it will always work on either your end or our end.  If the connection with a video visit is poor, the visit may have to be switched to a telephone visit.  With either a video or telephone visit, we are not always able to ensure that we have a secure connection.    ? ?I need to obtain your verbal consent now.   Are you willing to proceed with your visit today?  ?  ?Randall Ashe Graybeal has provided verbal consent on 11/25/2021 for a virtual visit (telephone). ?  ?Apolonio Schneiders, FNP  ? ?Date: 11/25/2021 8:36 AM ? ? ?Virtual Visit via Video Note  ? ?I, Apolonio Schneiders, connected with  Emma Bishop  ([id number], 1966-07-28) on 11/25/21 at  8:30 AM EST by a video-enabled telemedicine application and verified that I am speaking with the correct person using two identifiers. ? ?Location: ?Patient: Virtual Visit Location Patient: Home ?Provider: Virtual Visit Location Provider: Home Office ?  ?I discussed the limitations of evaluation and management by  telemedicine and the availability of in person appointments. The patient expressed understanding and agreed to proceed.   ? ?History of Present Illness: ?Emma Bishop is a 56 y.o. who identifies as a female who was assigned female at birth, and is being seen today after testing positive for COVID last night. She has been feeling tired and feels she could not taste her food yesterday.  ?She has been clearing her throat without a true cough.  ? ?She has not had COVID in the past ?She has been vaccinate for COVID with a recent booster  ? ?She has a history of DM and HTN  ? ? ? ?Problems: There are no problems to display for this patient. ?  ?Allergies: No Known Allergies ?Medications:  ?Current Outpatient Medications:  ?  aspirin EC 81 MG tablet, Take 81 mg by mouth daily., Disp: , Rfl:  ?  glipiZIDE (GLUCOTROL) 10 MG tablet, Take 10 mg by mouth daily before breakfast., Disp: , Rfl:  ?  ibuprofen (ADVIL,MOTRIN) 800 MG tablet, Take 1 tablet (800 mg total) by mouth 3 (three) times daily., Disp: 30 tablet, Rfl: 0 ?  linagliptin (TRADJENTA) 5 MG TABS tablet, Take 5 mg by mouth daily., Disp: , Rfl:  ?  lisinopril-hydrochlorothiazide (PRINZIDE,ZESTORETIC) 20-25 MG tablet, Take 1 tablet by mouth daily., Disp: , Rfl:  ?  metFORMIN (GLUCOPHAGE) 1000 MG tablet, Take 1,000 mg  by mouth 2 (two) times daily with a meal., Disp: , Rfl:  ? ?Observations/Objective: ?Phone visit no physical exam performed  ? ?Assessment and Plan: ?Advised patient to come to the office for evaluation  ? ?Follow Up Instructions: ?I discussed the assessment and treatment plan with the patient. The patient was provided an opportunity to ask questions and all were answered. The patient agreed with the plan and demonstrated an understanding of the instructions.  A copy of instructions were sent to the patient via MyChart unless otherwise noted below.  ? ? ?The patient was advised to call back or seek an in-person evaluation if the symptoms worsen or  if the condition fails to improve as anticipated. ? ?Time:  ?I spent 10 minutes with the patient via telehealth technology discussing the above problems/concerns.   ? ?Apolonio Schneiders, FNP  ?

## 2021-12-09 ENCOUNTER — Other Ambulatory Visit: Payer: Self-pay

## 2021-12-09 ENCOUNTER — Ambulatory Visit
Admission: RE | Admit: 2021-12-09 | Discharge: 2021-12-09 | Disposition: A | Discharge status: Home / Self Care | Payer: BC Managed Care – PPO | Source: Ambulatory Visit | Attending: Primary Care | Admitting: Primary Care

## 2021-12-09 DIAGNOSIS — Z1231 Encounter for screening mammogram for malignant neoplasm of breast: Secondary | ICD-10-CM

## 2021-12-09 DIAGNOSIS — N6002 Solitary cyst of left breast: Secondary | ICD-10-CM | POA: Insufficient documentation

## 2021-12-09 DIAGNOSIS — N6320 Unspecified lump in the left breast, unspecified quadrant: Secondary | ICD-10-CM | POA: Diagnosis present

## 2022-05-02 ENCOUNTER — Ambulatory Visit (INDEPENDENT_AMBULATORY_CARE_PROVIDER_SITE_OTHER): Payer: Self-pay | Admitting: Registered Nurse

## 2022-05-02 ENCOUNTER — Encounter: Payer: Self-pay | Admitting: Registered Nurse

## 2022-05-02 DIAGNOSIS — U071 COVID-19: Secondary | ICD-10-CM

## 2022-05-02 NOTE — Progress Notes (Signed)
Subjective:    Patient ID: Emma Bishop, female    DOB: [birth year], 56 y.o.   MRN: [id number]  56y/o african [place] female established patient requested telephone visit.  Patient consented to telephone visit.  This visit was conducted entirely via telephone.  I spent 11 minutes on the telephone with patient.  Patient was unable to connect via video or refused video appt type.  visit was completed via audio only.   Location patient home  Location provider [facility].  Patient reported URI symptoms started last Thursday  Took covid home test Friday 11 Aug positive.  Did teledoc visit and prescribed paxlovid.  Unsure if can return to work since she hasn't finished all paxlovid just started on 12 Aug.  Appetite getting better today tired of just soup and water otherwise had only had water and soup past couple of days.  Urinating her usual amount but has headache.  Felt really bad Thursday "out of it"  Feeling much better today.  Patient reported has drank more than half of 40 bottles water she bought last week.  Last home blood sugar check 100 before eating 120 after.      Review of Systems  Constitutional:  Positive for activity change, appetite change and fatigue. Negative for chills and diaphoresis.  HENT:  Positive for congestion, postnasal drip and sore throat. Negative for tinnitus, trouble swallowing and voice change.   Eyes:  Negative for photophobia and visual disturbance.  Respiratory:  Negative for cough, shortness of breath, wheezing and stridor.   Gastrointestinal:  Positive for nausea. Negative for abdominal pain, diarrhea and vomiting.  Endocrine: Negative for cold intolerance and heat intolerance.  Genitourinary:  Negative for difficulty urinating.  Musculoskeletal:  Negative for gait problem and neck pain.  Skin:  Negative for rash.  Allergic/Immunologic: Negative for food allergies.  Neurological:  Positive for headaches. Negative for dizziness,  tremors, seizures, syncope, facial asymmetry, speech difficulty, weakness, light-headedness and numbness.  Hematological:  Negative for adenopathy. Does not bruise/bleed easily.  Psychiatric/Behavioral:  Negative for agitation and sleep disturbance.        Objective:   Physical Exam HENT:     Nose: No congestion.  Pulmonary:     Effort: No respiratory distress.     Breath sounds: Normal breath sounds and air entry. No stridor or transmitted upper airway sounds. No wheezing.     Comments: Spoke full sentences without difficulty; no audible wheeze/cough/throat clearing during 11 minute call Neurological:     Mental Status: She is alert and oriented to person, place, and time.  Psychiatric:        Attention and Perception: Attention normal.        Mood and Affect: Mood normal.        Speech: Speech normal.        Behavior: Behavior normal. Behavior is cooperative.        Thought Content: Thought content normal.        Cognition and Memory: Cognition and memory normal.        Judgment: Judgment normal.           Assessment & Plan:   A-lab test positive covid  P-total time on phone 11 minutes.  Discussed with patient 5 day home quarantine recommended per CDC.  Her symptoms started day prior to positive home covid test.  Day 0 29 Apr 2022 positive test.  She did not go to work day symptoms started.  She may RTW 05 May 2022 with mask wear through 09 May 2022.  Finish paxlovid 3 tabs BID x 5 days as prescribed by teledoc.  Patient asking if she should be taking all of her diabetes medications since not eating a normal diet.  Discussed to check home blood sugar as she has monitor at home and if her usual 100 before eating she can take her medication as long as she is eating regular meals.  Discussed protein intake e.g. chicken, cheese, nuts, yogurt greek.  If low blood sugar less than 100 before eating but tolerating po intake without difficulty, continue only with blood pressure medication  and metformin.   If she is eating her usual  amounts/foods to take all of her medication.  Reviewed Epic last Hgba1c on file 7.9/8.5 Patient to follow up with PCM/clinic if not feeling well enough to work starting 17 Aug with mask for re-evaluation. Discussed with patient illness can raise blood sugar even if not eating regular meals.  Avoid dehydration drink water to keep urine pale yellow clear and voiding every 2-4 hours when awake.  Exitcare handouts covid quarantine/isolation and diabetes sick care management.   Patient verbalized understanding information/instructions, agreed with plan of care and had no further questions at this time.

## 2022-05-02 NOTE — Patient Instructions (Signed)
Diabetes Mellitus and Sick Day Management Blood sugar (glucose) can be difficult to control when you are sick. Common illnesses that can cause problems for people with diabetes (diabetes mellitus) include colds, fever, flu (influenza), nausea, vomiting, and diarrhea. These illnesses can cause stress and loss of body fluids (dehydration), and those issues can cause blood glucose levels to increase. Because of this, it is very important to take your insulin and diabetes medicines and eat some form of carbohydrate when you are sick. You should make a plan for days when you are sick (sick day plan) as part of your diabetes management plan. You and your health care provider should make this plan in advance. The following guidelines are intended to help you manage an illness that lasts for about 24 hours or less. Your health care provider may also give you more specific instructions. How to manage your blood glucose  Check your blood glucose every 2-4 hours, or as often as told by your health care provider. If you use insulin, take your usual dose. If your blood glucose continues to be too high, you may need to take an additional insulin dose as told by your health care provider. Know your sick day treatment goals. Your target blood glucose levels may be different when you are sick. If you use oral diabetes medicine, continue to take your medicines. Have a plan with your health care provider for these medicines while you are sick. If you use injectable hormone medicines other than insulin to control your diabetes, have a plan with your health care provider for these medicines while you are sick. Follow these instructions at home Check your ketones If you have type 1 diabetes, check your urine ketones every 4 hours. If you have type 2 diabetes, check your urine ketones as often as told by your health care provider. Eating and drinking Drink enough fluid to keep your urine pale yellow. This is especially  important if you have a fever, vomiting, or diarrhea. Those symptoms can lead to dehydration. Follow instructions from your health care provider about beverages to avoid. Do not drink alcohol, caffeine, or drinks that contain a lot of sugar. You need to eat some form of carbohydrates when you are sick. Eat 45-50 grams (45-50 g) of carbohydrates every 3-4 hours until you feel better. All of the food choices below contain about 15 g of carbohydrates. Plan ahead and keep some of these foods around so you have them if you get sick. 4-6 oz (120-177 mL) carbonated beverage that contains sugar, such as regular (not diet) soda. You may be able to drink carbonated beverages more easily if you open the beverage and let it sit at room temperature for a few minutes before drinking.  of a twin frozen ice pop. 4 oz (120 g) regular gelatin. 4 oz (120 mL) fruit juice. 4 oz (120 g) ice cream or frozen yogurt. 2 oz (60 g) sherbet. 1 slice bread or toast. 6 saltine crackers. 5 vanilla wafers. Medicines Take-over-the-counter and prescription medicines only as told by your health care provider. Check medicine labels for added sugars. Some medicines may contain sugar or types of sugars that can raise your blood glucose level. Questions to ask your health care provider Should I adjust my diabetes medicines? How often do I need to check my blood glucose? What supplies do I need to manage my diabetes at home when I am sick? What number can I call if I have questions? What foods and drinks should  I avoid? Contact a health care provider if: You have been sick or have had a fever for 2 days or longer and you are not getting better. Your blood glucose is at or above 240 mg/dl (13.3 mmol/L), even after you take an additional insulin dose. You are unable to drink fluids without vomiting. You have any of the following for more than 6 hours: Nausea. Vomiting. Diarrhea. Get help right away if: You have difficulty  breathing. You have moderate or high ketone levels in your urine. You have a change in how you think, feel, or act (mental status). You develop symptoms of diabetic ketoacidosis. These include: Nausea. Vomiting. Excessive thirst. Excessive urination. Fruity or sweet smelling breath. Rapid breathing. Pain in the abdomen. Your blood glucose is lower than '54mg'$ /dl (3.0 mmol/L). You used emergency glucagon to treat low blood glucose. These symptoms may represent a serious problem that is an emergency. Do not wait to see if the symptoms will go away. Get medical help right away. Call your local emergency services (911 in the [place] Do not drive yourself to the hospital. Summary Blood sugar (glucose) can be difficult to control when you are sick. Common illnesses that can cause problems for people with diabetes (diabetes mellitus) include colds, fever, flu (influenza), nausea, vomiting, and diarrhea. Illnesses can cause stress and loss of body fluids (dehydration), and those issues can cause blood glucose levels to increase. Make a plan for days when you are sick (sick day plan) as part of your diabetes management plan. You and your health care provider should make this plan in advance. It is very important to take your insulin and diabetes medicines and to eat some form of carbohydrate when you are sick. Contact your health care provider if have problems managing your blood glucose levels when you are sick, or if you have been sick or had a fever for 2 days or longer and are not getting better. This information is not intended to replace advice given to you by your health care provider. Make sure you discuss any questions you have with your health care provider. Document Revised: 09/26/2019 Document Reviewed: 09/26/2019 Elsevier Patient Education  [place] Under Monitoring Name: Emma Bishop  Location: [address] [place] [address]   Infection Prevention  Recommendations for Individuals Confirmed to have, or Being Evaluated for, 2019 Novel Coronavirus (COVID-19) Infection Who Receive Care at Home  Individuals who are confirmed to have, or are being evaluated for, COVID-19 should follow the prevention steps below until a healthcare provider or local or state health department says they can return to normal activities.  Stay home except to get medical care You should restrict activities outside your home, except for getting medical care. Do not go to work, school, or public areas, and do not use public transportation or taxis.  Call ahead before visiting your doctor Before your medical appointment, call the healthcare provider and tell them that you have, or are being evaluated for, COVID-19 infection. This will help the healthcare provider's office take steps to keep other people from getting infected. Ask your healthcare provider to call the local or state health department.  Monitor your symptoms Seek prompt medical attention if your illness is worsening (e.g., difficulty breathing). Before going to your medical appointment, call the healthcare provider and tell them that you have, or are being evaluated for, COVID-19 infection. Ask your healthcare provider to call the local or state health department.  Wear a  facemask You should wear a facemask that covers your nose and mouth when you are in the same room with other people and when you visit a healthcare provider. People who live with or visit you should also wear a facemask while they are in the same room with you.  Separate yourself from other people in your home As much as possible, you should stay in a different room from other people in your home. Also, you should use a separate bathroom, if available.  Avoid sharing household items You should not share dishes, drinking glasses, cups, eating utensils, towels, bedding, or other items with other people in your home. After using  these items, you should wash them thoroughly with soap and water.  Cover your coughs and sneezes Cover your mouth and nose with a tissue when you cough or sneeze, or you can cough or sneeze into your sleeve. Throw used tissues in a lined trash can, and immediately wash your hands with soap and water for at least 20 seconds or use an alcohol-based hand rub.  Wash your [facility] your hands often and thoroughly with soap and water for at least 20 seconds. You can use an alcohol-based hand sanitizer if soap and water are not available and if your hands are not visibly dirty. Avoid touching your eyes, nose, and mouth with unwashed hands.   Prevention Steps for Caregivers and Household Members of Individuals Confirmed to have, or Being Evaluated for, COVID-19 Infection Being Cared for in the Home  If you live with, or provide care at home for, a person confirmed to have, or being evaluated for, COVID-19 infection please follow these guidelines to prevent infection:  Follow healthcare provider's instructions Make sure that you understand and can help the patient follow any healthcare provider instructions for all care.  Provide for the patient's basic needs You should help the patient with basic needs in the home and provide support for getting groceries, prescriptions, and other personal needs.  Monitor the patient's symptoms If they are getting sicker, call his or her medical provider and tell them that the patient has, or is being evaluated for, COVID-19 infection. This will help the healthcare provider's office take steps to keep other people from getting infected. Ask the healthcare provider to call the local or state health department.  Limit the number of people who have contact with the patient If possible, have only one caregiver for the patient. Other household members should stay in another home or place of residence. If this is not possible, they should stay in another room,  or be separated from the patient as much as possible. Use a separate bathroom, if available. Restrict visitors who do not have an essential need to be in the home.  Keep older adults, very young children, and other sick people away from the patient Keep older adults, very young children, and those who have compromised immune systems or chronic health conditions away from the patient. This includes people with chronic heart, lung, or kidney conditions, diabetes, and cancer.  Ensure good ventilation Make sure that shared spaces in the home have good air flow, such as from an air conditioner or an opened window, weather permitting.  Wash your hands often Wash your hands often and thoroughly with soap and water for at least 20 seconds. You can use an alcohol based hand sanitizer if soap and water are not available and if your hands are not visibly dirty. Avoid touching your eyes, nose, and mouth with  unwashed hands. Use disposable paper towels to dry your hands. If not available, use dedicated cloth towels and replace them when they become wet.  Wear a facemask and gloves Wear a disposable facemask at all times in the room and gloves when you touch or have contact with the patient's blood, body fluids, and/or secretions or excretions, such as sweat, saliva, sputum, nasal mucus, vomit, urine, or feces.  Ensure the mask fits over your nose and mouth tightly, and do not touch it during use. Throw out disposable facemasks and gloves after using them. Do not reuse. Wash your hands immediately after removing your facemask and gloves. If your personal clothing becomes contaminated, carefully remove clothing and launder. Wash your hands after handling contaminated clothing. Place all used disposable facemasks, gloves, and other waste in a lined container before disposing them with other household waste. Remove gloves and wash your hands immediately after handling these items.  Do not share dishes,  glasses, or other household items with the patient Avoid sharing household items. You should not share dishes, drinking glasses, cups, eating utensils, towels, bedding, or other items with a patient who is confirmed to have, or being evaluated for, COVID-19 infection. After the person uses these items, you should wash them thoroughly with soap and water.  Wash laundry thoroughly Immediately remove and wash clothes or bedding that have blood, body fluids, and/or secretions or excretions, such as sweat, saliva, sputum, nasal mucus, vomit, urine, or feces, on them. Wear gloves when handling laundry from the patient. Read and follow directions on labels of laundry or clothing items and detergent. In general, wash and dry with the warmest temperatures recommended on the label.  Clean all areas the individual has used often Clean all touchable surfaces, such as counters, tabletops, doorknobs, bathroom fixtures, toilets, phones, keyboards, tablets, and bedside tables, every day. Also, clean any surfaces that may have blood, body fluids, and/or secretions or excretions on them. Wear gloves when cleaning surfaces the patient has come in contact with. Use a diluted bleach solution (e.g., dilute bleach with 1 part bleach and 10 parts water) or a household disinfectant with a label that says EPA-registered for coronaviruses. To make a bleach solution at home, add 1 tablespoon of bleach to 1 quart (4 cups) of water. For a larger supply, add  cup of bleach to 1 gallon (16 cups) of water. Read labels of cleaning products and follow recommendations provided on product labels. Labels contain instructions for safe and effective use of the cleaning product including precautions you should take when applying the product, such as wearing gloves or eye protection and making sure you have good ventilation during use of the product. Remove gloves and wash hands immediately after cleaning.  Monitor yourself for signs and  symptoms of illness Caregivers and household members are considered close contacts, should monitor their health, and will be asked to limit movement outside of the home to the extent possible. Follow the monitoring steps for close contacts listed on the symptom monitoring form.   ? If you have additional questions, contact your local health department or call the epidemiologist on call at [phone] (available 24/7). ? This guidance is subject to change. For the most up-to-date guidance from [facility], please refer to their website: [email or web address]

## 2022-05-05 ENCOUNTER — Telehealth: Payer: Self-pay | Admitting: Nurse Practitioner

## 2022-05-05 NOTE — Telephone Encounter (Signed)
Discussed with patient about return to work  Tested positive for COVID 8/11 Started anti-viral on 8/12 finished yesterday   Improving overall needs return to work note

## 2022-10-05 IMAGING — US US BREAST*L* LIMITED INC AXILLA
1 series · 9 of 9 positions shown · non-contrast
Comparison: Previous exam(s).

CLINICAL DATA: 55-year-old female presenting as a recall from
screening for possible left breast mass.

EXAM:
DIGITAL DIAGNOSTIC UNILATERAL LEFT MAMMOGRAM WITH TOMOSYNTHESIS AND
CAD; ULTRASOUND LEFT BREAST LIMITED
TECHNIQUE: Left digital diagnostic mammography and breast tomosynthesis was
performed. The images were evaluated with computer-aided detection.;
Targeted ultrasound examination of the left breast was performed.

[Series 1: us breast*left* limited inc axilla · 0.05mm/px · 9 of 9 slices shown]
[im 1/9]
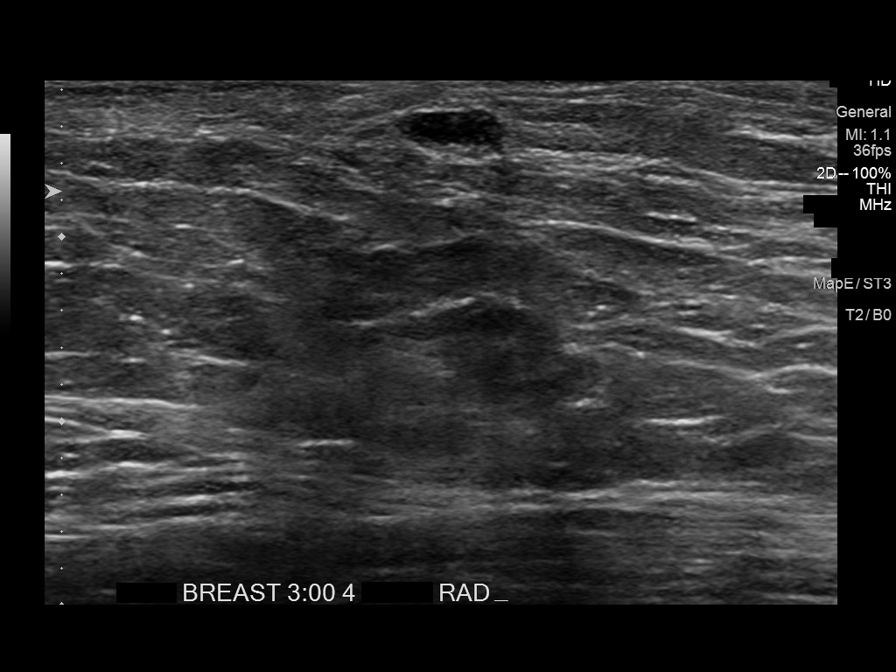
[im 2/9]
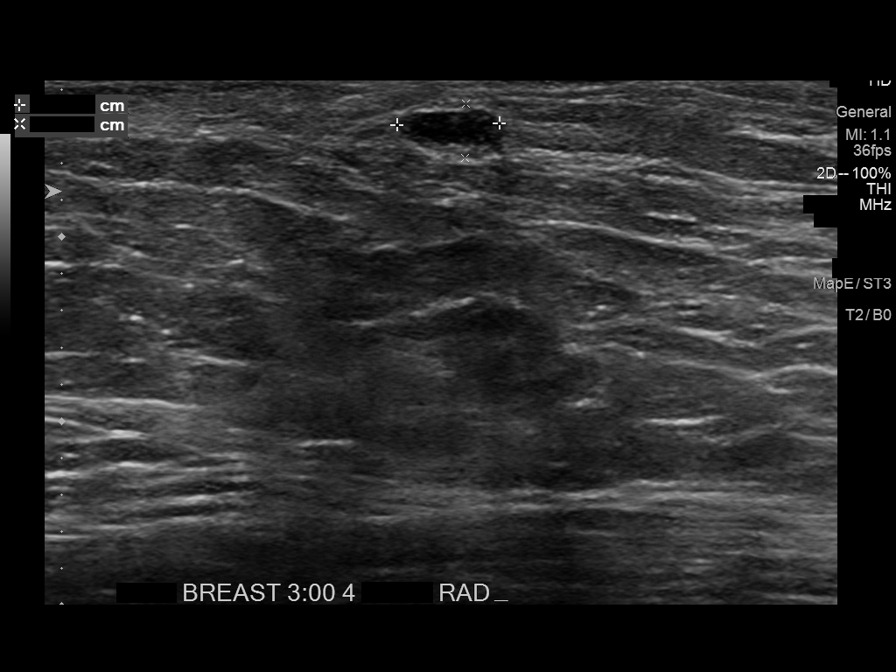
[im 3/9]
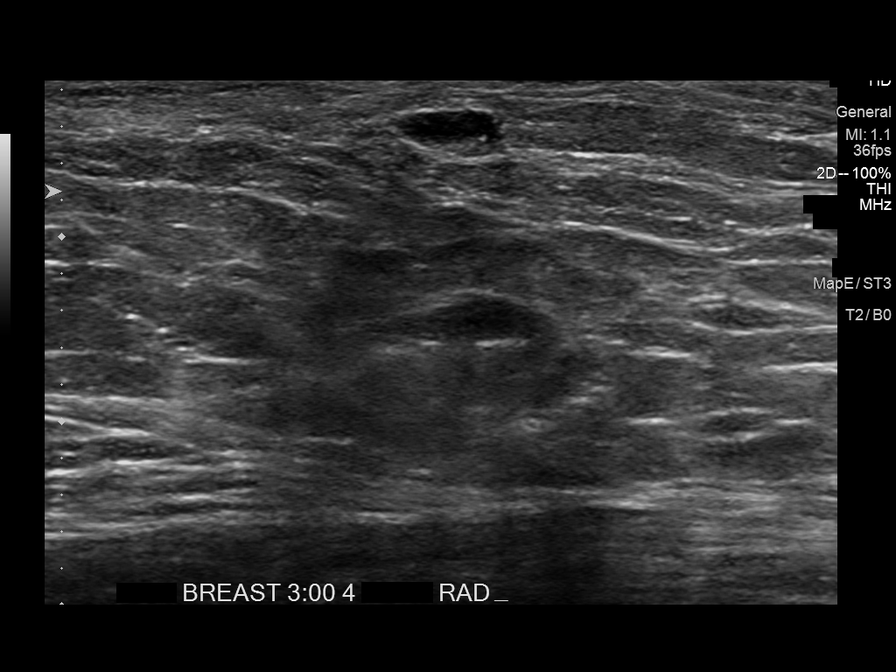
[im 4/9]
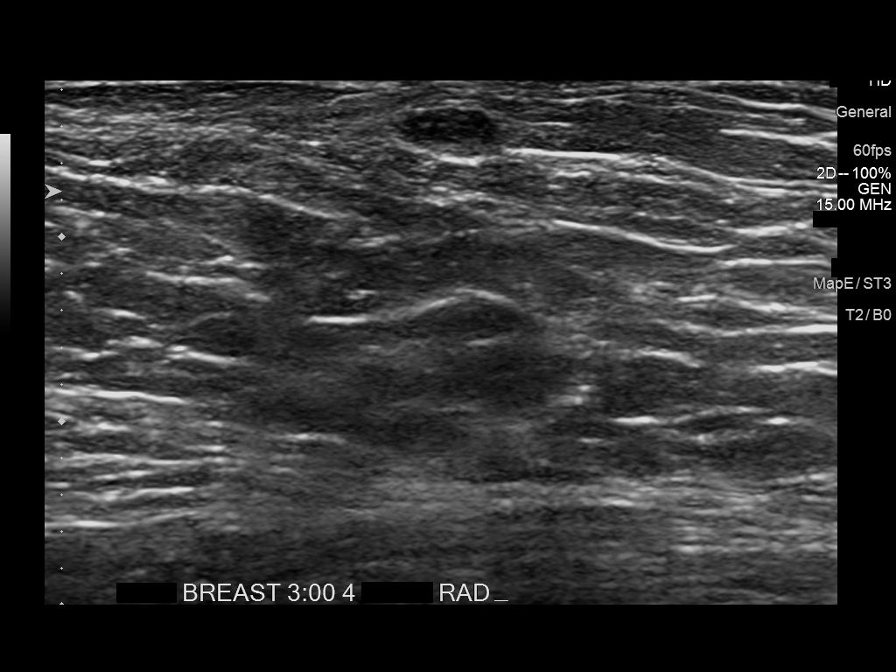
[im 5/9]
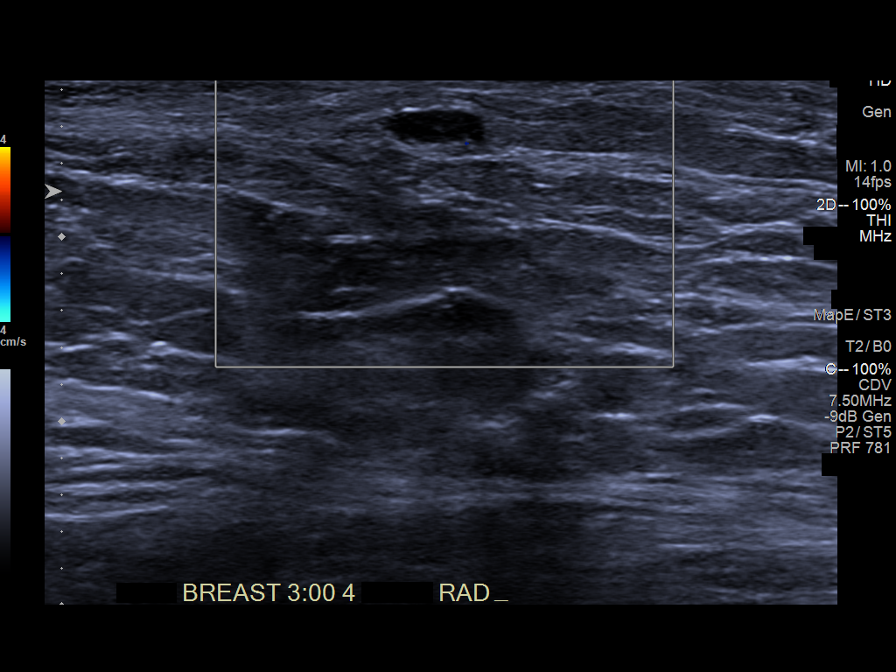
[im 6/9]
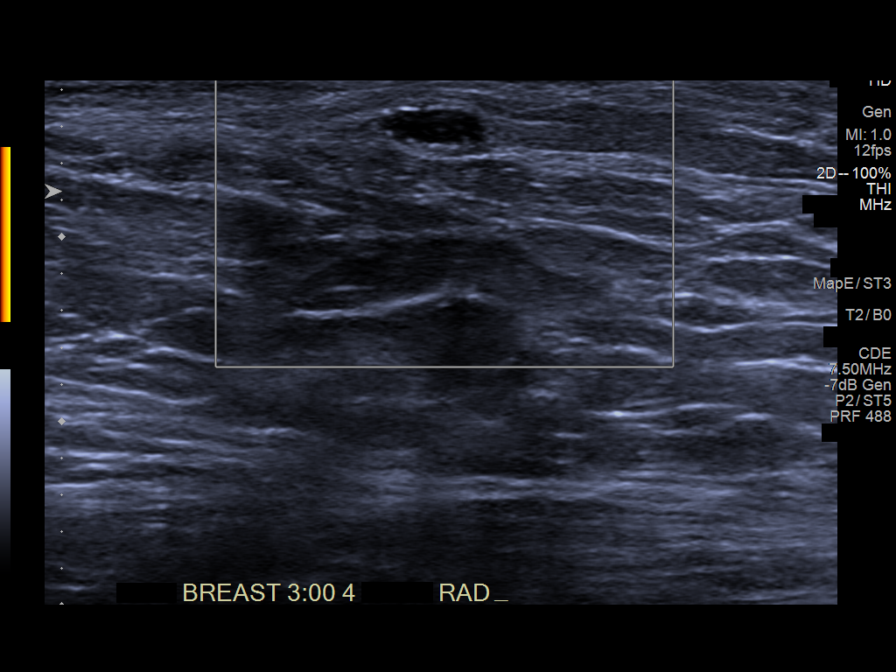
[im 7/9]
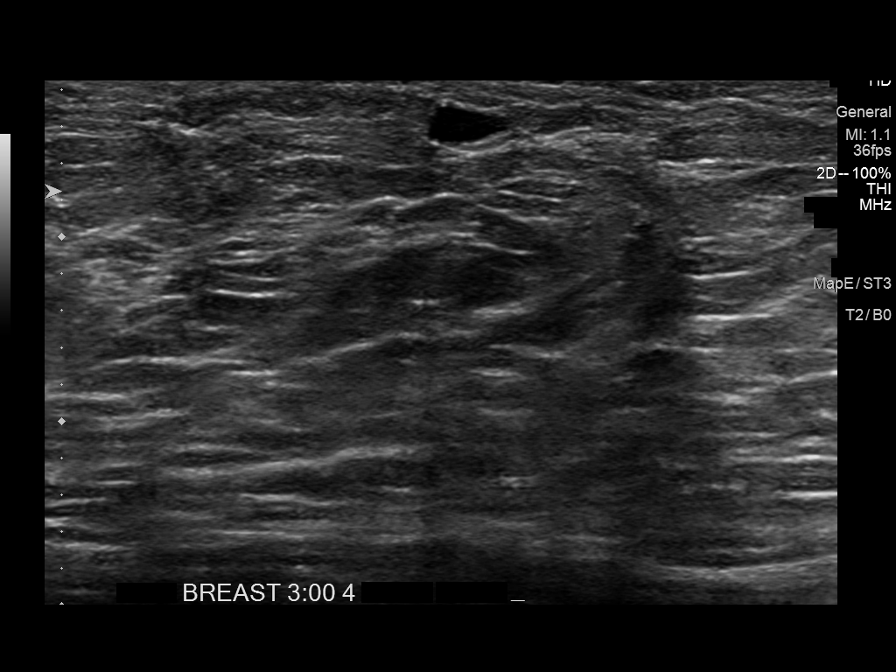
[im 8/9]
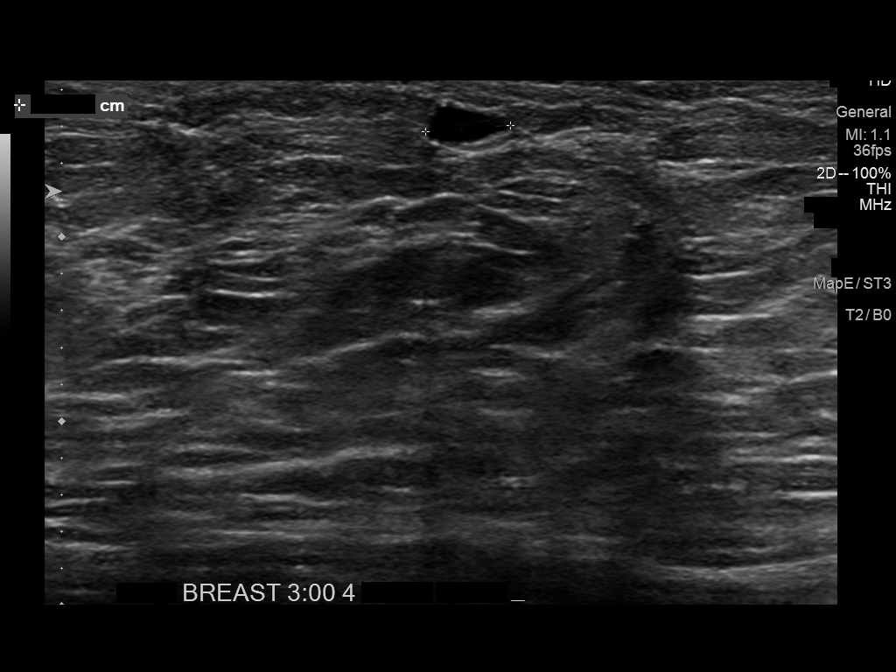
[im 9/9]
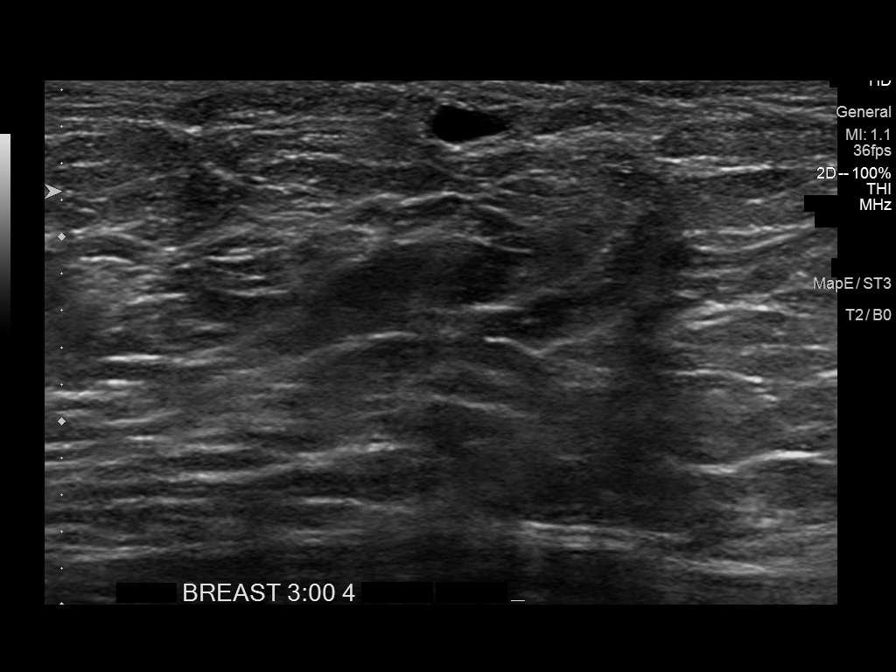

[9 of 9 positions shown; findings below may reference images not displayed]

ACR Breast Density Category b: There are scattered areas of
fibroglandular density.
FINDINGS: Mammogram:

Spot compression tomosynthesis views of the left breast were
performed demonstrating persistence of an oval circumscribed mass in
the outer superficial breast measuring approximately 0.5 cm.

Ultrasound:

Targeted ultrasound performed in the left breast at 3 o'clock 4 cm
from the nipple demonstrating an oval circumscribed anechoic mass
measuring 0.6 x 0.3 x 0.5 cm, consistent with a benign cyst. No
internal vascularity. This corresponds to the mammographic finding.
IMPRESSION: Benign cyst in the left breast at 3 o'clock.

RECOMMENDATION:
Screening mammogram in one year.(Code:[id number])

I have discussed the findings and recommendations with the patient.
If applicable, a reminder letter will be sent to the patient
regarding the next appointment.

BI-RADS CATEGORY  2: Benign.

## 2022-10-17 ENCOUNTER — Other Ambulatory Visit: Payer: Self-pay | Admitting: Primary Care

## 2022-10-17 DIAGNOSIS — Z1231 Encounter for screening mammogram for malignant neoplasm of breast: Secondary | ICD-10-CM

## 2022-11-17 ENCOUNTER — Ambulatory Visit
Admission: RE | Admit: 2022-11-17 | Discharge: 2022-11-17 | Disposition: A | Discharge status: Home / Self Care | Payer: BC Managed Care – PPO | Source: Ambulatory Visit | Attending: Primary Care | Admitting: Primary Care

## 2022-11-17 DIAGNOSIS — Z1231 Encounter for screening mammogram for malignant neoplasm of breast: Secondary | ICD-10-CM | POA: Diagnosis present

## 2023-03-28 ENCOUNTER — Ambulatory Visit (INDEPENDENT_AMBULATORY_CARE_PROVIDER_SITE_OTHER): Payer: Self-pay | Admitting: Physician Assistant

## 2023-03-28 DIAGNOSIS — U071 COVID-19: Secondary | ICD-10-CM

## 2023-03-28 NOTE — Progress Notes (Signed)
Virtual Visit Consent   Emma Bishop, you are scheduled for a virtual visit with a [facility] provider today. Just as with appointments in the office, your consent must be obtained to participate. Your consent will be active for this visit and any virtual visit you may have with one of our providers in the next 365 days. If you have a MyChart account, a copy of this consent can be sent to you electronically.  As this is a virtual telephone visit which doesn't allow your provider to perform a traditional examination and may limit your provider's ability to fully assess your condition. If your provider identifies any concerns that need to be evaluated in person or the need to arrange testing (such as labs, EKG, etc.), we will make arrangements to do so. Although advances in technology are sophisticated, we cannot ensure that it will always work on either your end or our end. If the connection with a video visit is poor, the visit may have to be switched to a telephone visit. With either a video or telephone visit, we are not always able to ensure that we have a secure connection.  By engaging in this virtual visit, you consent to the provision of healthcare and authorize for your insurance to be billed (if applicable) for the services provided during this visit. Depending on your insurance coverage, you may receive a charge related to this service.  I need to obtain your verbal consent now. Are you willing to proceed with your visit today? Emma Bishop has provided verbal consent on 03/28/2023 for a virtual visit (video or telephone). Gilberto Better, [place]  Date: 03/28/2023 8:18 AM  Virtual Visit via Telephone Note   I, Gilberto Better, connected with  Emma Bishop  ([id number], 1966-06-20) on 03/28/23 at  8:00 AM EDT by a telephone and verified that I am speaking with the correct person using two identifiers.  Location: Patient: Virtual Visit Location Patient: Home Provider: Virtual  Visit Location Provider: Office/Clinic   I discussed the limitations of evaluation and management by telemedicine and the availability of in person appointments. The patient expressed understanding and agreed to proceed.    History of Present Illness: Emma Bishop is a 58 y.o. who identifies as a female who was assigned female at birth, and is being seen today for cough and nasal congestion. Tested positive for Covid.   HPI: 57 y/o F via telehealth complains of cough, nasal congestion x 2 days. She tested positive last night for covid. Hasn't been to work because she hasn't felt well. Had a teledoc visit and was prescribed cough medicine and nasal decongestant. She does admit to feeling better today than yesterday.   URI     Problems: There are no problems to display for this patient.   Allergies: No Known Allergies Medications:  Current Outpatient Medications:    aspirin EC 81 MG tablet, Take 81 mg by mouth daily. (Patient not taking: Reported on 05/02/2022), Disp: , Rfl:    fluticasone (FLONASE) 50 MCG/ACT nasal spray, Place 2 sprays into both nostrils daily. (Patient not taking: Reported on 05/02/2022), Disp: 16 g, Rfl: 6   JANUMET XR [phone] MG TB24, Take 1 tablet by mouth daily., Disp: , Rfl:    JARDIANCE 25 MG TABS tablet, Take 25 mg by mouth daily. (Patient not taking: Reported on 05/02/2022), Disp: , Rfl:    lisinopril-hydrochlorothiazide (PRINZIDE,ZESTORETIC) 20-25 MG tablet, Take 1 tablet by mouth daily., Disp: , Rfl:    pioglitazone (  ACTOS) 15 MG tablet, Take 15 mg by mouth daily., Disp: , Rfl:   Observations/Objective: Patient is well-developed, well-nourished in no acute distress.  Resting comfortably at home.  Head is normocephalic, atraumatic.  No labored breathing.  Speech is clear and coherent with logical content.  Patient is alert and oriented at baseline.    Assessment and Plan: 1. COVID-19 virus infection  Increase fluid intake. May take Tylenol or  Ibuprofen for headache as needed. REST. Take medicines previously prescribed. Reviewed CDC guidelines with patient. Pt will be isolated for 5 days from the start of her symptoms. Needs to be fever free for 24 hours without the aid of medicine before returning back to work and must wear a mask for an additional 5 days. Pt verbalized understanding and in agreement.  Pt will contact us if she has questions or concerns.  A work excuse note in chart per patient's request.     Follow Up Instructions: I discussed the assessment and treatment plan with the patient. The patient was provided an opportunity to ask questions and all were answered. The patient agreed with the plan and demonstrated an understanding of the instructions.  A copy of instructions were sent to the patient via MyChart unless otherwise noted below.    The patient was advised to call back or seek an in-person evaluation if the symptoms worsen or if the condition fails to improve as anticipated.  Time:  I spent 10 minutes with the patient via telehealth technology discussing the above problems/concerns.    Gilberto Better, PA-C

## 2023-04-08 ENCOUNTER — Emergency Department
Admission: EM | Admit: 2023-04-08 | Discharge: 2023-04-08 | Disposition: A | Discharge status: Home / Self Care | Payer: BC Managed Care – PPO | Attending: Emergency Medicine | Admitting: Emergency Medicine

## 2023-04-08 ENCOUNTER — Other Ambulatory Visit: Payer: Self-pay

## 2023-04-08 ENCOUNTER — Emergency Department: Payer: BC Managed Care – PPO

## 2023-04-08 DIAGNOSIS — R2243 Localized swelling, mass and lump, lower limb, bilateral: Secondary | ICD-10-CM | POA: Insufficient documentation

## 2023-04-08 DIAGNOSIS — R079 Chest pain, unspecified: Secondary | ICD-10-CM | POA: Diagnosis present

## 2023-04-08 DIAGNOSIS — R600 Localized edema: Secondary | ICD-10-CM | POA: Insufficient documentation

## 2023-04-08 DIAGNOSIS — Z8616 Personal history of COVID-19: Secondary | ICD-10-CM | POA: Diagnosis not present

## 2023-04-08 DIAGNOSIS — R002 Palpitations: Secondary | ICD-10-CM

## 2023-04-08 LAB — BASIC METABOLIC PANEL
Anion gap: 5 (ref 5–15)
BUN: 22 mg/dL — ABNORMAL HIGH (ref 6–20)
CO2: 25 mmol/L (ref 22–32)
Calcium: 8.8 mg/dL — ABNORMAL LOW (ref 8.9–10.3)
Chloride: 104 mmol/L (ref 98–111)
Creatinine, Ser: 1.19 mg/dL — ABNORMAL HIGH (ref 0.44–1.00)
GFR, Estimated: 53 mL/min — ABNORMAL LOW (ref >60)
Glucose, Bld: 103 mg/dL — ABNORMAL HIGH (ref 70–99)
Potassium: 4.7 mmol/L (ref 3.5–5.1)
Sodium: 134 mmol/L — ABNORMAL LOW (ref 135–145)

## 2023-04-08 LAB — CBC
HCT: 33.8 % — ABNORMAL LOW (ref 36.0–46.0)
Hemoglobin: 10.9 g/dL — ABNORMAL LOW (ref 12.0–15.0)
MCH: 26.5 pg (ref 26.0–34.0)
MCHC: 32.2 g/dL (ref 30.0–36.0)
MCV: 82 fL (ref 80.0–100.0)
Platelets: 238 10*3/uL (ref 150–400)
RBC: 4.12 MIL/uL (ref 3.87–5.11)
RDW: 13.7 % (ref 11.5–15.5)
WBC: 4.2 10*3/uL (ref 4.0–10.5)
nRBC: 0 % (ref 0.0–0.2)

## 2023-04-08 LAB — TROPONIN I (HIGH SENSITIVITY): Troponin I (High Sensitivity): 3 ng/L (ref <18)

## 2023-04-08 LAB — CBG MONITORING, ED: Glucose-Capillary: 84 mg/dL (ref 70–99)

## 2023-04-08 NOTE — Discharge Instructions (Signed)
Please seek care for any leg pain, coughing up blood, worsening shortness of breath, chest pain, high fevers, or any other new or concerning symptoms.

## 2023-04-08 NOTE — ED Triage Notes (Signed)
Pt reports last night had trouble sleeping, felt fluttering in her chest, ankle swelling left one. Pt reports felt some shortness of breath. Pt reports she had COVID 2 weeks ago. Pt reports tightness in her chest. Pt talks in complete sentences no respiratory distress noted

## 2023-04-08 NOTE — ED Provider Notes (Signed)
[facility] Provider Note    Event Date/Time   First MD Initiated Contact with Patient 04/08/23 1723     (approximate)   History   Leg swelling   HPI  Emma Bishop is a 57 y.o. female who presents to the emergency department today because of concern for leg swelling, palpitations. The leg swelling has been present for the past 2 days. She denies any associated pain. States that they are getting less swollen. Had been diagnosed with covid about 2 weeks ago. Past 4 days has been at work on her feet. Denies any sharp chest pain, no hemoptysis.  No recent travel.    Physical Exam   Triage Vital Signs: ED Triage Vitals  Encounter Vitals Group     BP 04/08/23 1523 (!) 147/77     Systolic BP Percentile --      Diastolic BP Percentile --      Pulse Rate 04/08/23 1523 75     Resp 04/08/23 1523 16     Temp 04/08/23 1523 98.3 F (36.8 C)     Temp Source 04/08/23 1523 Oral     SpO2 04/08/23 1523 95 %     Weight 04/08/23 1523 281 lb (127.5 kg)     Height 04/08/23 1523 5\' 3"  (1.6 m)     Head Circumference --      Peak Flow --      Pain Score 04/08/23 1522 0     Pain Loc --      Pain Education --      Exclude from Growth Chart --     Most recent vital signs: Vitals:   04/08/23 1523  BP: (!) 147/77  Pulse: 75  Resp: 16  Temp: 98.3 F (36.8 C)  SpO2: 95%   General: Awake, alert, oriented. CV:  Good peripheral perfusion. Regular rate and rhythm. Resp:  Normal effort. Lungs clear. Abd:  No distention.  Other:  Bilateral non pitting edema in lower extremities. No tenderness.    ED Results / Procedures / Treatments   Labs (all labs ordered are listed, but only abnormal results are displayed) Labs Reviewed  BASIC METABOLIC PANEL - Abnormal; Notable for the following components:      Result Value   Sodium 134 (*)    Glucose, Bld 103 (*)    BUN 22 (*)    Creatinine, Ser 1.19 (*)    Calcium 8.8 (*)    GFR, Estimated 53 (*)    All other  components within normal limits  CBC - Abnormal; Notable for the following components:   Hemoglobin 10.9 (*)    HCT 33.8 (*)    All other components within normal limits  CBG MONITORING, ED  TROPONIN I (HIGH SENSITIVITY)  TROPONIN I (HIGH SENSITIVITY)     EKG  I, Phineas Semen, attending physician, personally viewed and interpreted this EKG  EKG Time: 1524 Rate: 68 Rhythm: normal sinus rhythm Axis: left axis deviation Intervals: qtc 433 QRS: low voltage QRS ST changes: no st elevation Impression: abnormal ekg    RADIOLOGY I independently interpreted and visualized the CXR. My interpretation: No pneumonia Radiology interpretation:  IMPRESSION:  No active cardiopulmonary process.      PROCEDURES:  Critical Care performed: No  MEDICATIONS ORDERED IN ED: Medications - No data to display   IMPRESSION / MDM / ASSESSMENT AND PLAN / ED COURSE  I reviewed the triage vital signs and the nursing notes.  Differential diagnosis includes, but is not limited to, venous insufficiency, fluid overload, PE  Patient's presentation is most consistent with acute presentation with potential threat to life or bodily function.   Patient presented to the emergency department today because of concern for lower extremity swelling and palpitations. On exam does have some swelling to lower legs. Non tender. No respiratory distress. X-ray and blood work without concerning findings. Did discuss possibility of blood clot at patient but at this time have very low concern. Think it is reasonable for patient do be discharged. Already has appointment scheduled with PCP this upcoming week. Discussed return precautions.       FINAL CLINICAL IMPRESSION(S) / ED DIAGNOSES   Final diagnoses:  Peripheral edema  Palpitations    Note:  This document was prepared using Dragon voice recognition software and may include unintentional dictation errors.    Phineas Semen, MD 04/08/23 [phone]

## 2023-08-29 ENCOUNTER — Telehealth: Payer: Self-pay

## 2023-08-29 NOTE — Telephone Encounter (Signed)
Patient called in left a voicemail stating request to schedule her colonoscopy. I called the patient back to inform her we receive her message, I sent message to the scheduler. The patient said if she doesn't answer please leave a message.

## 2023-08-30 ENCOUNTER — Telehealth: Payer: Self-pay

## 2023-08-30 NOTE — Telephone Encounter (Signed)
Pt requesting call back to schedule pt wants you to schedule appointment than leave message on appointment on vm

## 2023-08-31 ENCOUNTER — Other Ambulatory Visit: Payer: Self-pay

## 2023-08-31 ENCOUNTER — Telehealth: Payer: Self-pay

## 2023-08-31 DIAGNOSIS — Z860100 Personal history of colon polyps, unspecified: Secondary | ICD-10-CM

## 2023-08-31 MED ORDER — NA SULFATE-K SULFATE-MG SULF 17.5-3.13-1.6 GM/177ML PO SOLN: 1.0000 | Freq: Once | ORAL | 0 refills | Status: AC

## 2023-08-31 NOTE — Telephone Encounter (Signed)
Gastroenterology Pre-Procedure Review  Request Date: 10/04/23 Requesting Physician: Dr. Tobi Bastos  PATIENT REVIEW QUESTIONS: The patient responded to the following health history questions as indicated:    1. Are you having any GI issues? no 2. Do you have a personal history of Polyps? yes (last colonoscopy performed by Dr. Maximino Greenland 09/21/2018 recommended repeat in 5 years) 3. Do you have a family history of Colon Cancer or Polyps? yes (uncle had colon cancer) 4. Diabetes Mellitus? yes (takes janumet  and actos advised to stop janumet 2 days prior to colonoscopy.  Stop actos 1 days prior to colonoscopy noted on instructions) 5. Joint replacements in the past 12 months?no 6. Major health problems in the past 3 months?no 7. Any artificial heart valves, MVP, or defibrillator?no    MEDICATIONS & ALLERGIES:    Patient reports the following regarding taking any anticoagulation/antiplatelet therapy:   Plavix, Coumadin, Eliquis, Xarelto, Lovenox, Pradaxa, Brilinta, or Effient? no Aspirin? no  Patient confirms/reports the following medications:  Current Outpatient Medications  Medication Sig Dispense Refill   aspirin EC 81 MG tablet Take 81 mg by mouth daily.     JANUMET XR [phone] MG TB24 Take 1 tablet by mouth daily.     lisinopril-hydrochlorothiazide (PRINZIDE,ZESTORETIC) 20-25 MG tablet Take 1 tablet by mouth daily.     Na Sulfate-K Sulfate-Mg Sulf 17.5-3.13-1.6 GM/177ML SOLN Take 1 kit by mouth once for 1 dose. 354 mL 0   pioglitazone (ACTOS) 15 MG tablet Take 15 mg by mouth daily.     fluticasone (FLONASE) 50 MCG/ACT nasal spray Place 2 sprays into both nostrils daily. (Patient not taking: Reported on 08/31/2023) 16 g 6   JARDIANCE 25 MG TABS tablet Take 25 mg by mouth daily. (Patient not taking: Reported on 05/02/2022)     No current facility-administered medications for this visit.    Patient confirms/reports the following allergies:  No Known Allergies  No orders of the defined types  were placed in this encounter.   AUTHORIZATION INFORMATION Primary Insurance: 1D#: Group #:  Secondary Insurance: 1D#: Group #:  SCHEDULE INFORMATION: Date: 10/04/23 Time: Location: [facility]

## 2023-08-31 NOTE — Telephone Encounter (Signed)
Pt returned your call. PT states works 3rd shift and some times it hard to get in touch with her.

## 2023-09-27 ENCOUNTER — Encounter: Payer: Self-pay | Admitting: Gastroenterology

## 2023-10-04 ENCOUNTER — Ambulatory Visit: Payer: BC Managed Care – PPO | Admitting: Anesthesiology

## 2023-10-04 ENCOUNTER — Encounter: Payer: Self-pay | Admitting: Gastroenterology

## 2023-10-04 ENCOUNTER — Ambulatory Visit
Admission: RE | Admit: 2023-10-04 | Discharge: 2023-10-04 | Disposition: A | Discharge status: Home / Self Care | Payer: BC Managed Care – PPO | Attending: Gastroenterology | Admitting: Gastroenterology

## 2023-10-04 ENCOUNTER — Other Ambulatory Visit: Payer: Self-pay

## 2023-10-04 ENCOUNTER — Encounter
Admission: RE | Disposition: A | Discharge status: Home / Self Care | Payer: Self-pay | Source: Home / Self Care | Attending: Gastroenterology

## 2023-10-04 DIAGNOSIS — Z1211 Encounter for screening for malignant neoplasm of colon: Secondary | ICD-10-CM | POA: Diagnosis present

## 2023-10-04 DIAGNOSIS — K5730 Diverticulosis of large intestine without perforation or abscess without bleeding: Secondary | ICD-10-CM | POA: Insufficient documentation

## 2023-10-04 DIAGNOSIS — Z79899 Other long term (current) drug therapy: Secondary | ICD-10-CM | POA: Insufficient documentation

## 2023-10-04 DIAGNOSIS — Z87891 Personal history of nicotine dependence: Secondary | ICD-10-CM | POA: Insufficient documentation

## 2023-10-04 DIAGNOSIS — F32A Depression, unspecified: Secondary | ICD-10-CM | POA: Insufficient documentation

## 2023-10-04 DIAGNOSIS — Z7982 Long term (current) use of aspirin: Secondary | ICD-10-CM | POA: Diagnosis not present

## 2023-10-04 DIAGNOSIS — I10 Essential (primary) hypertension: Secondary | ICD-10-CM | POA: Diagnosis not present

## 2023-10-04 DIAGNOSIS — Z87442 Personal history of urinary calculi: Secondary | ICD-10-CM | POA: Insufficient documentation

## 2023-10-04 DIAGNOSIS — E119 Type 2 diabetes mellitus without complications: Secondary | ICD-10-CM | POA: Insufficient documentation

## 2023-10-04 DIAGNOSIS — Z860100 Personal history of colon polyps, unspecified: Secondary | ICD-10-CM

## 2023-10-04 DIAGNOSIS — Z7984 Long term (current) use of oral hypoglycemic drugs: Secondary | ICD-10-CM | POA: Diagnosis not present

## 2023-10-04 HISTORY — PX: COLONOSCOPY WITH PROPOFOL: SHX5780

## 2023-10-04 LAB — GLUCOSE, CAPILLARY: Glucose-Capillary: 97 mg/dL (ref 70–99)

## 2023-10-04 SURGERY — COLONOSCOPY WITH PROPOFOL
Anesthesia: General

## 2023-10-04 MED ORDER — LIDOCAINE HCL (PF) 2 % IJ SOLN
INTRAMUSCULAR | Status: AC
  Filled 2023-10-04: qty 10

## 2023-10-04 MED ORDER — STERILE WATER FOR IRRIGATION IR SOLN
Status: DC | PRN
  Administered 2023-10-04: 50 mL

## 2023-10-04 MED ORDER — LIDOCAINE HCL (CARDIAC) PF 100 MG/5ML IV SOSY
PREFILLED_SYRINGE | INTRAVENOUS | Status: DC | PRN
  Administered 2023-10-04: 100 mg via INTRAVENOUS

## 2023-10-04 MED ORDER — DEXMEDETOMIDINE HCL IN NACL 80 MCG/20ML IV SOLN
INTRAVENOUS | Status: DC | PRN
  Administered 2023-10-04: 20 ug via INTRAVENOUS

## 2023-10-04 MED ORDER — PROPOFOL 10 MG/ML IV BOLUS
INTRAVENOUS | Status: DC | PRN
  Administered 2023-10-04 (×2): 30 mg via INTRAVENOUS

## 2023-10-04 MED ORDER — SODIUM CHLORIDE 0.9 % IV SOLN: INTRAVENOUS | Status: DC

## 2023-10-04 MED ORDER — PROPOFOL 500 MG/50ML IV EMUL
INTRAVENOUS | Status: DC | PRN
  Administered 2023-10-04: 75 ug/kg/min via INTRAVENOUS

## 2023-10-04 MED ORDER — EPHEDRINE SULFATE-NACL 50-0.9 MG/10ML-% IV SOSY
PREFILLED_SYRINGE | INTRAVENOUS | Status: DC | PRN
  Administered 2023-10-04: 15 mg via INTRAVENOUS
  Administered 2023-10-04: 10 mg via INTRAVENOUS

## 2023-10-04 NOTE — H&P (Signed)
  Luke Salaam, MD [address], [place], [place], [address] [address], [place], [place], [address] Phone: [phone]  Fax: [phone]  Primary Care Physician:  Ardia Kraft, NP   Pre-Procedure History & Physical: HPI:  Emma Bishop is a 58 y.o. female is here for an colonoscopy.   Past Medical History:  Diagnosis Date   Depression    Diabetes mellitus without complication (HCC)    History of kidney stones    Hypertension     Past Surgical History:  Procedure Laterality Date   COLONOSCOPY WITH PROPOFOL  N/A 09/21/2018   Procedure: COLONOSCOPY WITH PROPOFOL ;  Surgeon: Irby Mannan, MD;  Location: [facility] ENDOSCOPY;  Service: Gastroenterology;  Laterality: N/A;    Prior to Admission medications   Medication Sig Start Date End Date Taking? Authorizing Provider  aspirin EC 81 MG tablet Take 81 mg by mouth daily.    [provider]  fluticasone  (FLONASE ) 50 MCG/ACT nasal spray Place 2 sprays into both nostrils daily. Patient not taking: Reported on 08/31/2023 11/25/21   Mardene Shake, FNP  JANUMET XR [phone] MG TB24 Take 1 tablet by mouth daily. 11/14/21   [provider]  lisinopril-hydrochlorothiazide (PRINZIDE,ZESTORETIC) 20-25 MG tablet Take 1 tablet by mouth daily.    [provider]  pioglitazone (ACTOS) 15 MG tablet Take 15 mg by mouth daily. 04/08/22   [provider]    Allergies as of 08/31/2023   (No Known Allergies)    Family History  Problem Relation Age of Onset   Colon cancer Maternal Uncle    Breast cancer Neg Hx     Social History   Socioeconomic History   Marital status: Single    Spouse name: Not on file   Number of children: Not on file   Years of education: Not on file   Highest education level: Not on file  Occupational History   Not on file  Tobacco Use   Smoking status: Former   Smokeless tobacco: Never  Vaping Use   Vaping status: Never Used  Substance and Sexual  Activity   Alcohol use: Yes   Drug use: No   Sexual activity: Not on file  Other Topics Concern   Not on file  Social History Narrative   Not on file   Social Drivers of Health   Financial Resource Strain: Not on file  Food Insecurity: Not on file  Transportation Needs: Not on file  Physical Activity: Not on file  Stress: Not on file  Social Connections: Not on file  Intimate Partner Violence: Not on file    Review of Systems: See HPI, otherwise negative ROS  Physical Exam: LMP 11/04/2015 (Approximate) Comment: negative preg test General:   Alert,  pleasant and cooperative in NAD Head:  Normocephalic and atraumatic. Neck:  Supple; no masses or thyromegaly. Lungs:  Clear throughout to auscultation, normal respiratory effort.    Heart:  +S1, +S2, Regular rate and rhythm, No edema. Abdomen:  Soft, nontender and nondistended. Normal bowel sounds, without guarding, and without rebound.   Neurologic:  Alert and  oriented x4;  grossly normal neurologically.  Impression/Plan: Emma Bishop is here for an colonoscopy to be performed for surveillance due to prior history of colon polyps   Risks, benefits, limitations, and alternatives regarding  colonoscopy have been reviewed with the patient.  Questions have been answered.  All parties agreeable.   Luke Salaam, MD  10/04/2023, 8:08 AM

## 2023-10-04 NOTE — Anesthesia Postprocedure Evaluation (Signed)
 Anesthesia Post Note  Patient: Leala Olive  Procedure(s) Performed: COLONOSCOPY WITH PROPOFOL   Patient location during evaluation: Endoscopy Anesthesia Type: General Level of consciousness: awake and alert Pain management: pain level controlled Vital Signs Assessment: post-procedure vital signs reviewed and stable Respiratory status: spontaneous breathing, nonlabored ventilation, respiratory function stable and patient connected to nasal cannula oxygen Cardiovascular status: blood pressure returned to baseline and stable Postop Assessment: no apparent nausea or vomiting Anesthetic complications: no   No notable events documented.   Last Vitals:  Vitals:   10/04/23 0920 10/04/23 0930  BP: (!) 97/50 (!) 99/59  Pulse: 97 91  Resp: (!) 24 14  Temp: (!) 35.9 C   SpO2: 100% 100%    Last Pain:  Vitals:   10/04/23 0920  TempSrc: Temporal  PainSc: Asleep                 Nancey Awkward

## 2023-10-04 NOTE — Op Note (Signed)
 [facility] Gastroenterology Patient Name: Emma Bishop Procedure Date: 10/04/2023 8:22 AM MRN: [id number] Account #: [id number] Date of Birth: August 11, 1966 Admit Type: Outpatient Age: 58 Room: [facility] ENDO ROOM 3 Gender: Female Note Status: Finalized Instrument Name: Charlyn Cooley [id number] Procedure:             Colonoscopy Indications:           Screening for colorectal malignant neoplasm Providers:             Luke Salaam MD, MD Referring MD:          Willa Haring (Referring MD) Medicines:             Monitored Anesthesia Care Complications:         No immediate complications. Procedure:             Pre-Anesthesia Assessment:                        - Prior to the procedure, a History and Physical was                         performed, and patient medications, allergies and                         sensitivities were reviewed. The patient's tolerance                         of previous anesthesia was reviewed.                        - The risks and benefits of the procedure and the                         sedation options and risks were discussed with the                         patient. All questions were answered and informed                         consent was obtained.                        - ASA Grade Assessment: II - A patient with mild                         systemic disease.                        After obtaining informed consent, the colonoscope was                         passed under direct vision. Throughout the procedure,                         the patient's blood pressure, pulse, and oxygen                         saturations were monitored continuously. The                         Colonoscope was introduced  through the anus and                         advanced to the the cecum, identified by the                         appendiceal orifice. The colonoscopy was performed                         with ease. The patient tolerated the procedure well.                          The quality of the bowel preparation was excellent.                         The ileocecal valve, appendiceal orifice, and rectum                         were photographed. Findings:      The perianal and digital rectal examinations were normal.      Multiple small-mouthed diverticula were found in the left colon.      The entire examined colon appeared normal on direct and retroflexion       views. Impression:            - Diverticulosis in the left colon.                        - The entire examined colon is normal on direct and                         retroflexion views.                        - No specimens collected. Recommendation:        - Discharge patient to home (with escort).                        - Resume previous diet.                        - Continue present medications. Procedure Code(s):     --- Professional ---                        [phone], Colonoscopy, flexible; diagnostic, including                         collection of specimen(s) by brushing or washing, when                         performed (separate procedure) Diagnosis Code(s):     --- Professional ---                        Z12.11, Encounter for screening for malignant neoplasm                         of colon                        K57.30, Diverticulosis of large intestine  without                         perforation or abscess without bleeding CPT copyright 2022 American [employer] Association. All rights reserved. The codes documented in this report are preliminary and upon coder review may  be revised to meet current compliance requirements. Luke Salaam, MD Luke Salaam MD, MD 10/04/2023 9:20:19 AM This report has been signed electronically. Number of Addenda: 0 Note Initiated On: 10/04/2023 8:22 AM Scope Withdrawal Time: 0 hours 7 minutes 58 seconds  Total Procedure Duration: 0 hours 10 minutes 42 seconds  Estimated Blood Loss:  Estimated blood loss: none.      [facility]

## 2023-10-04 NOTE — Anesthesia Preprocedure Evaluation (Signed)
 Anesthesia Evaluation  Patient identified by MRN, date of birth, ID band Patient awake    Reviewed: Allergy & Precautions, NPO status , Patient's Chart, lab work & pertinent test results  History of Anesthesia Complications Negative for: history of anesthetic complications  Airway Mallampati: III  TM Distance: >3 FB Neck ROM: full    Dental no notable dental hx.    Pulmonary neg pulmonary ROS, former smoker   Pulmonary exam normal        Cardiovascular hypertension, On Medications negative cardio ROS Normal cardiovascular exam     Neuro/Psych  PSYCHIATRIC DISORDERS  Depression    negative neurological ROS     GI/Hepatic negative GI ROS, Neg liver ROS,,,  Endo/Other  negative endocrine ROSdiabetes    Renal/GU negative Renal ROS  negative genitourinary   Musculoskeletal   Abdominal   Peds  Hematology negative hematology ROS (+)   Anesthesia Other Findings Past Medical History: No date: Depression No date: Diabetes mellitus without complication (HCC) No date: History of kidney stones No date: Hypertension  Past Surgical History: 09/21/2018: COLONOSCOPY WITH PROPOFOL ; N/A     Comment:  Procedure: COLONOSCOPY WITH PROPOFOL ;  Surgeon:               Irby Mannan, MD;  Location: [facility] ENDOSCOPY;                Service: Gastroenterology;  Laterality: N/A;  BMI    Body Mass Index: 53.15 kg/m      Reproductive/Obstetrics negative OB ROS                             Anesthesia Physical Anesthesia Plan  ASA: 3  Anesthesia Plan: General   Post-op Pain Management: Minimal or no pain anticipated   Induction: Intravenous  PONV Risk Score and Plan: 2 and Propofol  infusion and TIVA  Airway Management Planned: Natural Airway and Nasal Cannula  Additional Equipment:   Intra-op Plan:   Post-operative Plan:   Informed Consent: I have reviewed the patients History and Physical,  chart, labs and discussed the procedure including the risks, benefits and alternatives for the proposed anesthesia with the patient or authorized representative who has indicated his/her understanding and acceptance.     Dental Advisory Given  Plan Discussed with: Anesthesiologist, CRNA and Surgeon  Anesthesia Plan Comments: (Patient consented for risks of anesthesia including but not limited to:  - adverse reactions to medications - risk of airway placement if required - damage to eyes, teeth, lips or other oral mucosa - nerve damage due to positioning  - sore throat or hoarseness - Damage to heart, brain, nerves, lungs, other parts of body or loss of life  Patient voiced understanding and assent.)       Anesthesia Quick Evaluation

## 2023-10-04 NOTE — Transfer of Care (Signed)
 Immediate Anesthesia Transfer of Care Note  Patient: Emma Bishop  Procedure(s) Performed: COLONOSCOPY WITH PROPOFOL   Patient Location: PACU  Anesthesia Type:General  Level of Consciousness: sedated  Airway & Oxygen Therapy: Patient Spontanous Breathing  Post-op Assessment: Report given to RN and Post -op Vital signs reviewed and stable  Post vital signs: Reviewed and stable  Last Vitals:  Vitals Value Taken Time  BP    Temp 35.9 C 10/04/23 0920  Pulse 92 10/04/23 0921  Resp 18 10/04/23 0921  SpO2 100 % 10/04/23 0921  Vitals shown include unfiled device data.  Last Pain:  Vitals:   10/04/23 0920  TempSrc: Temporal  PainSc: Asleep         Complications: No notable events documented.

## 2023-10-05 ENCOUNTER — Encounter: Payer: Self-pay | Admitting: Gastroenterology

## 2023-12-29 ENCOUNTER — Other Ambulatory Visit: Payer: Self-pay | Admitting: Primary Care

## 2023-12-29 DIAGNOSIS — Z1231 Encounter for screening mammogram for malignant neoplasm of breast: Secondary | ICD-10-CM

## 2024-01-16 ENCOUNTER — Ambulatory Visit
Admission: RE | Admit: 2024-01-16 | Discharge: 2024-01-16 | Disposition: A | Discharge status: Home / Self Care | Source: Ambulatory Visit | Attending: Primary Care | Admitting: Primary Care

## 2024-01-16 DIAGNOSIS — Z1231 Encounter for screening mammogram for malignant neoplasm of breast: Secondary | ICD-10-CM | POA: Diagnosis present
